# Patient Record
Sex: Male | Born: 1990 | Race: White | Hispanic: No | Marital: Single | Smoking: Never smoker
Health system: Southern US, Community
[De-identification: ages and names within clinical notes are randomized; demographics above are authoritative.]

## PROBLEM LIST (undated history)

## (undated) DIAGNOSIS — K2270 Barrett's esophagus without dysplasia: Secondary | ICD-10-CM

## (undated) DIAGNOSIS — K219 Gastro-esophageal reflux disease without esophagitis: Secondary | ICD-10-CM

## (undated) HISTORY — DX: Gastro-esophageal reflux disease without esophagitis: K21.9

## (undated) HISTORY — PX: UPPER GI ENDOSCOPY: SHX6162

## (undated) HISTORY — PX: WRIST SURGERY: SHX841

## (undated) HISTORY — DX: Barrett's esophagus without dysplasia: K22.70

## (undated) HISTORY — PX: FINGER FRACTURE SURGERY: SHX638

---
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
## 2010-07-05 DIAGNOSIS — K2270 Barrett's esophagus without dysplasia: Secondary | ICD-10-CM

## 2010-07-05 HISTORY — DX: Barrett's esophagus without dysplasia: K22.70

## 2010-11-20 NOTE — Op Note (Signed)
Reginald Mcdonald, Reginald Mcdonald                ACCOUNT NO.:  [id number]   MEDICAL RECORD NO.:  [id number]          PATIENT TYPE:  AMB   LOCATION:  DSC                          FACILITY:  [facility]   PHYSICIAN:  Katy Fitch. Sypher Jr., M.D.DATE OF BIRTH:  04-Jun-1991   DATE OF PROCEDURE:  04/03/2004  DATE OF DISCHARGE:                                 OPERATIVE REPORT   PREOPERATIVE DIAGNOSIS:  Painful mass, deep to thenar muscles adjacent to  scaphotrapeziotrapezoid joint, right wrist.   POSTOPERATIVE DIAGNOSIS:  Volar ganglion exiting from  scaphotrapeziotrapezoid articulation with subthenar muscle mass.   OPERATION:  Excision of capsular ganglion, right wrist, volar to  scaphotrapeziotrapezoid joint.   OPERATING SURGEON:  Katy Fitch. Sypher, M.D.   ASSISTANT:  Jonni Sanger, P.A.   ANESTHESIA:  General by LMA.   SUPERVISING ANESTHESIOLOGIST:  Guadalupe Maple, M.D.   INDICATIONS:  Reginald Mcdonald is a 20 year old, middle school student, and golf  athlete has had a painful mass develop at the base of his right thumb.  He  had pain while playing sports and while working out training.  Clinical  examination suggested a cystic mass deep to the thenar muscles.  This was  adjacent to the carpometacarpal joint and scaphotrapeziotrapezoid joint at  the base of his right thumb.   Due to a failure to respond to nonoperative measures, we recommended  exploration and excision.   He is brought to the operating room at this time.  Preoperatively, his  parents were advised that the pathophysiology of ganglions is poorly  understood.  They understand there is clear chance of recurrence or  reformation.   They understand that our goal is to remove the cyst, debride the joint  capsule, and to repair the capsule if possible.   After informed consent, he is brought to the operating room at this time.   PROCEDURE:  Reginald Mcdonald is brought to the operating room and placed in the  supine position upon the  operating table.  Following the induction of  general anesthesia by LMA, the right warm was prepped with Betadine soap and  solution and sterilely draped.  Following exsanguination of the limb with an  Esmarch bandage, an arterial tourniquet on the proximal brachium is inflated  to 220 mmHg.  The procedure commenced with a short oblique incision  paralleling the thenar creases.  Subcutaneous tissues were carefully divided  revealing a bulging abductor pollicis brevis muscle.   The interval between the opponens pollicis and abductor pollicis brevis  muscles was split bluntly, and immediately, a ganglion capsule the size of a  large marble was identified.   This was circumferentially dissected down to its capsular attachment.  This  appeared to be attached to the carpometacarpal joint of the thumb and had a  portion following proximally towards the flexor carpi radialis tendon sheath  radial aspect.  It was difficult to discern whether or not this was a  primary ganglion off the carpometacarpal joint of the thumb or had an  extension to the scaphotrapezial joint.   The capsule of the ganglion was entirely  excised, and the capsule of the  carpometacarpal joint as well as the scaphotrapeziotrapezoid joint was  debrided down to normal-appearing tissue.  The area of capsular opening was  then repaired with mattress suture of 4-0 Vicryl.   The wound was then repaired with simple suture of 4-0 Vicryl closing the  interval between the thenar muscles and repair of the skin with intradermal  3-0 Prolene.   A compressive dressing was applied with Xeroflo, sterile gauze, and an Ace  wrap.  There were no apparent complications.   Mr. Harpole was discharged to the care of his family with prescriptions for  Darvocet-N 100 1 p.o. q.4-6 h. p.r.n. pain.  He was also advised to use  Advil;  i.e., ibuprofen 400-600 mg p.o. q.6 h p.r.n. pain.       RVS/MEDQ  D:  04/03/2004  T:  04/03/2004  Job:   [id number]   cc:   Ernestina Penna, M.D.  [address] [place]  [place] [address]  Fax: [phone]

## 2010-12-21 ENCOUNTER — Encounter: Payer: Self-pay | Admitting: Gastroenterology

## 2011-01-26 ENCOUNTER — Encounter: Payer: Self-pay | Admitting: Gastroenterology

## 2011-01-26 ENCOUNTER — Ambulatory Visit (INDEPENDENT_AMBULATORY_CARE_PROVIDER_SITE_OTHER): Payer: BC Managed Care – PPO | Admitting: Gastroenterology

## 2011-01-26 VITALS — BP 138/72 | HR 64 | Ht 67.0 in | Wt 141.0 lb

## 2011-01-26 DIAGNOSIS — K219 Gastro-esophageal reflux disease without esophagitis: Secondary | ICD-10-CM

## 2011-01-26 MED ORDER — DEXLANSOPRAZOLE 60 MG PO CPDR: 60.0000 mg | DELAYED_RELEASE_CAPSULE | Freq: Every day | ORAL | Status: DC

## 2011-01-26 NOTE — Progress Notes (Signed)
History of Present Illness:  This is a extremely pleasant 20 year old Caucasian male referred through the courtesy of Dr. Rudi Heap for evaluation of years of acid reflux symptoms manifested by regurgitation, burning substernal chest pain, but no dysphagia. He has used over-the-counter" acid suppressor" without relief. He has not had previous endoscopy or barium studies. There is no history of alcohol, cigarette, or NSAID abuse, anorexia, weight loss, melena or hematochezia or any hepatobiliary complaints. 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 No history of Raynaud's phenomenon/ symptoms of collagen vascular disease.  I have reviewed this patient's present history, medical and surgical past history, allergies and medications.     ROS: The remainder of the 10 point ROS is negative     Physical Exam: General well developed well nourished patient in no acute distress, appearing his stated age Eyes PERRLA, no icterus, fundoscopic exam per opthamologist Skin no lesions noted Neck supple, no adenopathy, no thyroid enlargement, no tenderness Chest clear to percussion and auscultation Heart no significant murmurs, gallops or rubs noted Abdomen no hepatosplenomegaly masses or tenderness, BS normal.  Extremities no acute joint lesions, edema, phlebitis or evidence of cellulitis. Neurologic patient oriented x 3, cranial nerves intact, no focal neurologic deficits noted. Psychological mental status normal and normal affect.  Assessment and plan: Probable large hiatal hernia with chronic GERD and rule out Barrett's mucosa. Because of his young age and severe symptoms, he may be a candidate for early fundoplication referral. I placed him all Dexilant 60 mg a day, standard antireflux maneuvers, he saw our patient education video acid reflux in his management. Time of endoscopy we'll do biopsies for H. pylori infection.  Encounter Diagnosis  Name  Primary?  . Esophageal reflux Yes

## 2011-01-26 NOTE — Patient Instructions (Signed)
Your procedure has been scheduled for 02/03/2011, please follow the seperate instructions.  Take Dexilant 60mg  once a day samples given and an rx sent to your pharmacy.    Acid Reflux (GERD) Acid reflux is also called gastroesophageal reflux disease (GERD). Your stomach makes acid to help digest food. Acid reflux happens when acid from your stomach goes into the tube between your mouth and stomach (esophagus). Your stomach is protected from the acid, but this tube is not. When acid gets into the tube, it may cause a burning feeling in the chest (heartburn). Besides heartburn, other health problems can happen if the acid keeps going into the tube. Some causes of acid reflux include:  Being overweight.   Smoking.   Drinking alcohol.   Eating large meals.   Eating meals and then going to bed right away.   Eating certain foods.   Increased stomach acid production.  HOME CARE  Take all medicine as told by your doctor.   You may need to:   Lose weight.   Avoid alcohol.   Quit smoking.   Do not eat big meals. It is better to eat smaller meals throughout the day.   Do not eat a meal and then nap or go to bed.   Sleep with your head higher than your stomach.   Avoid foods that bother you.   You may need more tests, or you may need to see a special doctor.  GET HELP RIGHT AWAY IF:  You have chest pain that is different than before.   You have pain that goes to your arms, jaw, or between your shoulder blades.   You throw up (vomit) blood, dark brown liquid, or your throw up looks like coffee grounds.   You have trouble swallowing.   You have trouble breathing or cannot stop coughing.   You feel dizzy or pass out.   Your skin is cool, wet, and pale.   Your medicine is not helping.  MAKE SURE YOU:   Understand these instructions.   Will watch your condition.   Will get help right away if you are not doing well or get worse.  Document Released: 12/08/2007 Document  Re-Released: 09/15/2009 [facility] Patient Information 2011 [place], [place].

## 2011-01-27 ENCOUNTER — Other Ambulatory Visit: Payer: Self-pay | Admitting: *Deleted

## 2011-01-27 ENCOUNTER — Encounter: Payer: Self-pay | Admitting: Gastroenterology

## 2011-01-27 MED ORDER — ESOMEPRAZOLE MAGNESIUM 40 MG PO CPDR: 40.0000 mg | DELAYED_RELEASE_CAPSULE | Freq: Every day | ORAL | Status: DC

## 2011-01-27 NOTE — Telephone Encounter (Signed)
Pt aware of the change per insurance he must try Nexium or omeprazole. Nexium sent he will call back if it does not work well.

## 2011-01-28 ENCOUNTER — Other Ambulatory Visit: Payer: Self-pay | Admitting: *Deleted

## 2011-01-28 MED ORDER — OMEPRAZOLE 40 MG PO CPDR: 40.0000 mg | DELAYED_RELEASE_CAPSULE | Freq: Every day | ORAL | Status: DC

## 2011-01-28 NOTE — Telephone Encounter (Signed)
Pt requesting cheaper alt medication so I have sent in omeprazole

## 2011-02-03 ENCOUNTER — Ambulatory Visit (AMBULATORY_SURGERY_CENTER): Payer: BC Managed Care – PPO | Admitting: Gastroenterology

## 2011-02-03 ENCOUNTER — Other Ambulatory Visit: Payer: BC Managed Care – PPO | Admitting: Gastroenterology

## 2011-02-03 ENCOUNTER — Encounter: Payer: Self-pay | Admitting: Gastroenterology

## 2011-02-03 VITALS — BP 138/73 | HR 69 | Temp 98.5°F | Resp 18 | Ht 67.0 in | Wt 140.0 lb

## 2011-02-03 DIAGNOSIS — K219 Gastro-esophageal reflux disease without esophagitis: Secondary | ICD-10-CM

## 2011-02-03 DIAGNOSIS — K2270 Barrett's esophagus without dysplasia: Secondary | ICD-10-CM

## 2011-02-03 MED ORDER — SODIUM CHLORIDE 0.9 % IV SOLN: 500.0000 mL | INTRAVENOUS | Status: DC

## 2011-02-03 NOTE — Patient Instructions (Signed)
Follow discharge instructions.  Continue your medications.  Dr. Norval Gable office will call with follow up appointment.

## 2011-02-04 ENCOUNTER — Telehealth: Payer: Self-pay

## 2011-02-04 NOTE — Telephone Encounter (Signed)

## 2011-02-09 ENCOUNTER — Encounter: Payer: Self-pay | Admitting: Gastroenterology

## 2011-02-10 ENCOUNTER — Other Ambulatory Visit: Payer: BC Managed Care – PPO | Admitting: Gastroenterology

## 2011-03-10 ENCOUNTER — Encounter: Payer: Self-pay | Admitting: *Deleted

## 2011-03-16 ENCOUNTER — Encounter: Payer: Self-pay | Admitting: Gastroenterology

## 2011-03-16 ENCOUNTER — Ambulatory Visit (INDEPENDENT_AMBULATORY_CARE_PROVIDER_SITE_OTHER): Payer: BC Managed Care – PPO | Admitting: Gastroenterology

## 2011-03-16 VITALS — BP 132/74 | HR 64 | Ht 67.0 in | Wt 139.0 lb

## 2011-03-16 DIAGNOSIS — L659 Nonscarring hair loss, unspecified: Secondary | ICD-10-CM | POA: Insufficient documentation

## 2011-03-16 DIAGNOSIS — K219 Gastro-esophageal reflux disease without esophagitis: Secondary | ICD-10-CM | POA: Insufficient documentation

## 2011-03-16 DIAGNOSIS — K2270 Barrett's esophagus without dysplasia: Secondary | ICD-10-CM | POA: Insufficient documentation

## 2011-03-16 MED ORDER — FINASTERIDE 1 MG PO TABS: 1.0000 mg | ORAL_TABLET | Freq: Every day | ORAL | Status: DC

## 2011-03-16 NOTE — Progress Notes (Signed)
History of Present Illness: This is a 20 year old Caucasian male with chronic GERD, currently doing well on omeprazole 40 mg a day. 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    Current Medications, Allergies, Past Medical History, Past Surgical History, Family History and Social History were reviewed in [employer] record.   Assessment and plan: Acid reflux reviewed with patient. I have her to continue regular PPI therapy with office followups yearly and endoscopy every 3 years for dysplasia screening. We have given him a prescription for Propecia as requested. We discussed Barrett's mucosa at length, and I think he understands its etiology and pathology. We will see him yearly or when necessary basis. Printed information concerning Barrett's mucosa supplied to the patient. No diagnosis found.

## 2011-03-16 NOTE — Patient Instructions (Signed)
Your prescription(s) have been sent to you pharmacy.   Barrett's Esophagus The esophagus is the muscular tube that carries food and saliva from the mouth to the stomach. Barrett's esophagus involves changes in the esophagus. Some of its lining is replaced by a type of tissue similar to that found in the intestine. This process is called intestinal metaplasia. While Barrett's esophagus may cause no symptoms itself, a small number of people with this condition develop a relatively rare but often deadly type of cancer of the esophagus. It is called esophageal adenocarcinoma. Barrett's esophagus is associated with the common condition called GERD (gastroesophageal reflux disease). HOW THE ESOPHAGUS WORKS The esophagus carries food, liquids, and saliva from the mouth to the stomach. The stomach acts as a container to start digestion and pump food and liquids into the intestines in a controlled process. Food can then be properly digested over time. Nutrients can be taken in (absorbed) by the intestines. The esophagus moves food to the stomach by coordinated contractions of its muscular lining. This process is automatic. People are usually not aware of it. Many people have felt their esophagus when they:  Swallow something too large.   Try to eat too quickly.   Drink very hot or cold liquids.  They then feel the movement of the food or drink down the esophagus into the stomach. This may be an uncomfortable feeling. DIGESTIVE TRACT The muscular layers of the esophagus are normally pinched together at both the upper and lower ends by muscles. These muscles are called sphincters. When a person swallows, the sphincters relax automatically. This allows food or drink to pass from the mouth, into the stomach. The muscles then close rapidly. This prevents the swallowed food or drink from leaking out of the stomach, back into the esophagus or into the mouth. These muscles make it possible to swallow while lying down  or even upside-down. When people belch to release swallowed air or gas from carbonated beverages, the sphincters relax. Then small amounts of food or drink may come back up, briefly. This condition is called reflux. The esophagus quickly squeezes the material back into the stomach. This is considered normal. These functions of the esophagus are an important part of everyday life. However, people who must have their esophagus removed, for example because of cancer, can live a relatively healthy life without it. GERD (GASTROESOPHAGEAL REFLUX DISEASE) Having some stomach contents (liquids or gas) sometimes reflux (come back up from the stomach into the esophagus) is considered normal. When it happens often, and causes other symptoms, it is considered a medical problem or disease. However, it is not necessarily a serious one or one that requires seeing a caregiver. The stomach produces acid and enzymes to digest food. When this mixture refluxes into the esophagus more often than normal or for a longer period of time than normal, it may produce symptoms. These symptoms are often called acid reflux. They are often described by people as heartburn, indigestion, or "gas". The symptoms often consist of a burning sensation below and behind the lower part of the breastbone or sternum. Almost everyone has experienced these symptoms at least once. This is typically a result of overeating. Other things that provoke GERD symptoms include:  Being overweight.   Eating certain types of foods.   Being pregnant.  In most people, GERD symptoms may last only a short time and require no treatment at all.  More continual symptoms are often quickly relieved by over-the-counter acid-reducing agents, such as antacids.  Other drugs used to relieve GERD symptoms are antisecretory drugs, such as histamine2 (H2) blockers or proton pump inhibitors. People who have symptoms often should talk with their caregiver. Other diseases can  have similar symptoms. Prescription medicines, together with other actions, might be needed to reduce reflux. GERD that is untreated over a long period can lead to problems. An example is an ulcer in the esophagus, that could cause bleeding. Another common problem is scar tissue that blocks the movement of swallowed food and drink through the esophagus. This condition is called stricture. Esophageal reflux may also cause certain less common symptoms. These include hoarseness or chronic cough. It sometimes provokes conditions such as asthma. While most patients find that lifestyle changes and acid-blocking drugs relieve their symptoms, caregivers sometimes advise surgery. Overall, GERD is one of the most common medical conditions. About 20 percent of the population can be affected over a lifetime. GERD AND BARRETT'S ESOPHAGUS The exact causes of Barrett's esophagus are not known. It is thought to be caused in part by the same factors that cause GERD. People who do not have heartburn can have Barrett's esophagus. However, it is found about 3 to 5 times more often in people with this condition. Barrett's esophagus is uncommon in children. The average age at diagnosis is 41. But it is usually difficult to know when the problem started. It is about twice as common in men as in women. It is much more common in white men than in men of other races. BARRETT'S ESOPHAGUS AND CANCER OF THE ESOPHAGUS Barrett's esophagus does not cause symptoms itself. However, it seems to precede the development of a particular kind of cancer. This cancer is esophageal adenocarcinoma. The risk of developing this cancer is 30 to 125 times higher in people who have Barrett's esophagus than in people who do not. This type of cancer is increasing quickly in white men. The increase is possibly related to the rise in obesity and GERD. For people who have Barrett's esophagus, the risk of getting cancer of the esophagus is small. It is less than  1 percent (0.4 percent to 0.5 percent) per year. Esophageal adenocarcinoma is often not curable. This is partly because the disease is often discovered at a late stage and treatments are not effective. DIAGNOSIS (HOW TO TELL WHAT IS WRONG) AND SCREENING Diagnosing Barrett's esophagus is not easy. At the present time it cannot be diagnosed based on symptoms, physical exam, or blood tests. The only useful test is upper gastrointestinal endoscopy and biopsy. In this procedure, a flexible tube called an endoscope is used. This tool is like a pencil sized flexible telescope. It has a light and tiny camera. It is passed into the esophagus. If the tissue appears suspicious to your caregiver, biopsies must be done. A biopsy is the removal of a small piece of tissue. This is done using a pincher-like device passed through the endoscope. A pathologist is a specialist who examines body tissue samples. He or she examines the tissue under a microscope to confirm the diagnosis. Looking for a medical problem in people who do not know whether they have one is called screening. Currently, there are no commonly accepted guidelines on who should have endoscopy, to check for Barrett's esophagus. There are many reasons for the lack of firm recommendations about screening. Among them are the great cost and occasional risk of side effects of the test. Also, the rate of finding Barrett's esophagus is low. Finding the problem early has not been  proven to prevent deaths from cancer. Many caregivers advise that adult patients who are over the age of 39 and have had GERD symptoms for a number of years have endoscopy, to see whether they have Barrett's esophagus. Screening for this condition in people who have no symptoms is not advised. TREATMENT Barrett's esophagus has no cure, other than surgical removal of the esophagus. This is a serious operation. Surgery is advised only for people who have a high risk of developing cancer or who  already have it. Most caregivers recommend treating GERD with acid-blocking drugs. This is sometimes linked to improvement in the extent of the Barrett's tissue. But this approach has not been proven to reduce the risk of cancer. Treating reflux with surgery for GERD also does not seem to cure Barrett's esophagus. Several experimental approaches are under study. One attempts to see whether destroying the Barrett's tissue by heat or other means, through an endoscope, can get rid of the condition. But this approach has potential risks and unknown effectiveness. LOOKING FOR DYSPLASIA AND CANCER Occasional endoscopic examinations to look for early warning signs of cancer are generally advised for people who have Barrett's esophagus. This approach is called surveillance. When people who have Barrett's esophagus develop cancer, the process seems to go through an intermediate stage. In this stage cancer cells appear in the Barrett's tissue. This condition is called dysplasia. It can be seen only in biopsies with a microscope. The process is patchy and cannot be seen directly through the endoscope. So, multiple biopsies must be taken. Even then, it can be missed. The process of change from Barrett's to cancer seems to happen only in a few patients. It is less than 1 percent per year. And it happens over a relatively long period of time. Most caregivers advise that patients with Barrett's esophagus undergo occasional endoscopy to have biopsies. The recommended time between endoscopies varies depending on circumstances. The best time interval has not been decided. TREATMENT FOR DYSPLASIA OR ESOPHAGEAL ADENOCARCINOMA If a person with Barrett's esophagus is found to have dysplasia or cancer, the caregiver will usually recommend surgery. This is if the person is strong enough and has a good chance of being cured. The type of surgery may vary. But it usually involves removing most of the esophagus and pulling the stomach up  into the chest to attach it to what remains of the esophagus. Many patients with Barrett's esophagus are elderly. They may have many other medical problems that make surgery unwise. In these patients, other methods to treat dysplasia are being studied. HOPE THROUGH RESEARCH Many important questions about Barrett's esophagus need further research to:  Find better ways to identify people who have the problem.   Find out what causes it.   Test treatments that may prevent or get rid of it.   Find better treatments for people who have Barrett's esophagus with cancer.  The [employer] and [employer]est[employer] Kid[employer] Diseases, and the [employer], sponsor research programs to study Barrett's esophagus. IMPORTANT POINTS TO REMEMBER  In Barrett's esophagus, the cells lining the esophagus change. They become similar to the cells lining the intestine.   Barrett's esophagus is connected with gastroesophageal reflux disease or GERD.   A small number of people with Barrett's esophagus may develop esophageal cancer.   Barrett's esophagus is diagnosed by upper gastrointestinal endoscopy and biopsy.   People who have Barrett's esophagus should have periodic esophageal exams.   Taking acid-blocking drugs for GERD may help  improve Barrett's esophagus.   Removal of the esophagus is recommended only for people who have a high risk of developing cancer or who already have it.  ADDITIONAL RESOURCES For more information about GERD or Barrett's esophagus, contact: [employer] ([employer] [address] [place], [place] [address] Phone: [phone] or [phone] Fax: [phone] Email: iffgd@iffgd .org Internet: www.iffgd.[facility] [employer] Information [employer] [address] [place], [place] [address] Email: nddic@info .[email or web address] Document Released: 09/11/2003 Document Re-Released:  12/09/2009 [facility] Patient Information 2011 [place], [place].

## 2011-04-14 LAB — URINALYSIS, ROUTINE W REFLEX MICROSCOPIC
Glucose, UA: NEGATIVE
Hgb urine dipstick: NEGATIVE
Leukocytes, UA: NEGATIVE
Protein, ur: 100 — AB
pH: 6

## 2011-04-14 LAB — URINE MICROSCOPIC-ADD ON

## 2011-07-15 ENCOUNTER — Other Ambulatory Visit: Payer: Self-pay | Admitting: Gastroenterology

## 2011-07-20 ENCOUNTER — Other Ambulatory Visit: Payer: Self-pay | Admitting: Gastroenterology

## 2011-07-20 MED ORDER — FINASTERIDE 1 MG PO TABS: 1.0000 mg | ORAL_TABLET | Freq: Every day | ORAL | Status: DC

## 2011-07-20 NOTE — Telephone Encounter (Signed)
Left message rx now sent

## 2011-09-01 ENCOUNTER — Other Ambulatory Visit: Payer: Self-pay | Admitting: Gastroenterology

## 2011-11-15 ENCOUNTER — Other Ambulatory Visit: Payer: Self-pay | Admitting: Gastroenterology

## 2012-03-16 ENCOUNTER — Other Ambulatory Visit: Payer: Self-pay | Admitting: Gastroenterology

## 2012-04-01 ENCOUNTER — Other Ambulatory Visit: Payer: Self-pay | Admitting: Gastroenterology

## 2012-04-04 ENCOUNTER — Other Ambulatory Visit: Payer: Self-pay | Admitting: Gastroenterology

## 2012-06-16 ENCOUNTER — Other Ambulatory Visit: Payer: Self-pay | Admitting: Gastroenterology

## 2012-06-16 NOTE — Telephone Encounter (Signed)
PATIENT NEEDS OFFICE VISIT FOR FURTHER REFILLS  

## 2012-08-11 ENCOUNTER — Other Ambulatory Visit: Payer: Self-pay | Admitting: Gastroenterology

## 2012-08-14 NOTE — Telephone Encounter (Signed)
PATIENT NEEDS OFFICE VISIT FOR FURTHER REFILLS  

## 2012-09-12 ENCOUNTER — Other Ambulatory Visit: Payer: Self-pay | Admitting: Gastroenterology

## 2012-10-09 ENCOUNTER — Telehealth: Payer: Self-pay | Admitting: Gastroenterology

## 2012-10-09 MED ORDER — FINASTERIDE 1 MG PO TABS: ORAL_TABLET | ORAL | Status: DC

## 2012-10-09 NOTE — Telephone Encounter (Signed)
Pt requested a refill on finasteride. Explained I will refill for 1 month, but he needs an appt. Pt will come in on 10/19/12.

## 2012-10-19 ENCOUNTER — Encounter: Payer: Self-pay | Admitting: Gastroenterology

## 2012-10-19 ENCOUNTER — Other Ambulatory Visit (INDEPENDENT_AMBULATORY_CARE_PROVIDER_SITE_OTHER): Payer: BC Managed Care – PPO

## 2012-10-19 ENCOUNTER — Ambulatory Visit (INDEPENDENT_AMBULATORY_CARE_PROVIDER_SITE_OTHER): Payer: BC Managed Care – PPO | Admitting: Gastroenterology

## 2012-10-19 ENCOUNTER — Telehealth: Payer: Self-pay | Admitting: Gastroenterology

## 2012-10-19 VITALS — BP 104/62 | HR 75 | Ht 67.5 in | Wt 144.0 lb

## 2012-10-19 DIAGNOSIS — K6389 Other specified diseases of intestine: Secondary | ICD-10-CM

## 2012-10-19 DIAGNOSIS — R1310 Dysphagia, unspecified: Secondary | ICD-10-CM

## 2012-10-19 DIAGNOSIS — R198 Other specified symptoms and signs involving the digestive system and abdomen: Secondary | ICD-10-CM

## 2012-10-19 DIAGNOSIS — K2270 Barrett's esophagus without dysplasia: Secondary | ICD-10-CM

## 2012-10-19 DIAGNOSIS — K219 Gastro-esophageal reflux disease without esophagitis: Secondary | ICD-10-CM

## 2012-10-19 LAB — HEPATIC FUNCTION PANEL
ALT: 21 U/L (ref 0–53)
AST: 15 U/L (ref 0–37)
Alkaline Phosphatase: 65 U/L (ref 39–117)
Bilirubin, Direct: 0.1 mg/dL (ref 0.0–0.3)
Total Bilirubin: 0.8 mg/dL (ref 0.3–1.2)

## 2012-10-19 LAB — BASIC METABOLIC PANEL
Chloride: 105 mEq/L (ref 96–112)
GFR: 146.04 mL/min (ref >60.00)
Potassium: 3.9 mEq/L (ref 3.5–5.1)
Sodium: 139 mEq/L (ref 135–145)

## 2012-10-19 LAB — TSH: TSH: 0.86 u[IU]/mL (ref 0.35–5.50)

## 2012-10-19 LAB — CBC WITH DIFFERENTIAL/PLATELET
Eosinophils Absolute: 0.1 10*3/uL (ref 0.0–0.7)
Eosinophils Relative: 2.6 % (ref 0.0–5.0)
HCT: 43.6 % (ref 39.0–52.0)
Lymphs Abs: 2 10*3/uL (ref 0.7–4.0)
MCHC: 34.6 g/dL (ref 30.0–36.0)
MCV: 86.4 fl (ref 78.0–100.0)
Monocytes Absolute: 0.5 10*3/uL (ref 0.1–1.0)
Platelets: 199 10*3/uL (ref 150.0–400.0)
RDW: 12.9 % (ref 11.5–14.6)
WBC: 5.4 10*3/uL (ref 4.5–10.5)

## 2012-10-19 LAB — FERRITIN: Ferritin: 131.1 ng/mL (ref 22.0–322.0)

## 2012-10-19 LAB — SEDIMENTATION RATE: Sed Rate: 4 mm/hr (ref 0–22)

## 2012-10-19 MED ORDER — PEG-KCL-NACL-NASULF-NA ASC-C 100 G PO SOLR: 1.0000 | Freq: Once | ORAL | Status: DC

## 2012-10-19 NOTE — Telephone Encounter (Signed)
Pt reports he can't afford to have both procedures done now, so he would like to R/S and do the EGD only; he will have EGD on 10/25/12.

## 2012-10-19 NOTE — Progress Notes (Signed)
This is a 22 year old Caucasian male with chronic GERD and endoscopy documented Barrett's mucosa.  He is been on Prilosec 40 mg a day but continues to complain of postprandial fullness in the subxiphoid area with what sounds like possible dysphagia for solid foods.  He also has crampy lower abdominal pain with alternating diarrhea and constipation, but denies rectal bleeding.  He is a father that has Crohn's disease.  He denies fever, chills, or systemic complaints.  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  Specifically denies rectal bleeding, hematemesis, anorexia weight loss, or other symptoms of malabsorption.  Current Medications, Allergies, Past Medical History, Past Surgical History, Family History and Social History were reviewed in [employer] record.  ROS: All systems were reviewed and are negative unless otherwise stated in the HPI.          Physical Exam: Healthy-appearing patient in no distress.  Blood pressure 104 was 62, pulse 75 and regular, and weight 144 with a BMI of 22.21.  Cannot appreciate stigmata of chronic liver disease.  Chest is clear he is in a regular rhythm without murmurs gallops or rubs.  There is no organomegaly, abdominal masses or tenderness.  Bowel sounds are normal.  Inspection of rectum is unremarkable as is rectal exam.  Stool is present and is guaiac negative.  Mental status is normal.    Assessment and Plan: I'm concerned that this patient may have an esophageal stricture associated with his chronic reflux and Barrett's mucosa, and he needs followup endoscopy and possible dilatation.  I have also scheduled him for colonoscopy per his bowel difficulties and his family history of inflammatory bowel disease.  He also may need high-resolution esophageal manometry.  Have urged him to continue his daily PPI therapy, and to avoid me sincerely been able to repeat his endoscopic exam and possible dilatation.   He also needs repeat esophageal biopsies for dysplasia screening Encounter Diagnoses  Name Primary?  Marland Kitchen Dysphagia, unspecified Yes  . Irregular bowel habits

## 2012-10-19 NOTE — Patient Instructions (Addendum)
Your physician has requested that you go to the basement for lab work before leaving today.  You have been scheduled for an endoscopy and colonoscopy with propofol. Please follow the written instructions given to you at your visit today. Please pick up your prep at the pharmacy within the next 1-3 days. If you use inhalers (even only as needed), please bring them with you on the day of your procedure.  Start Metamucil over the counter fiber supplement daily.  cc: Rudi Heap, MD  High-Fiber Diet Fiber is found in fruits, vegetables, and grains. A high-fiber diet encourages the addition of more whole grains, legumes, fruits, and vegetables in your diet. The recommended amount of fiber for adult males is 38 g per day. For adult females, it is 25 g per day. Pregnant and lactating women should get 28 g of fiber per day. If you have a digestive or bowel problem, ask your caregiver for advice before adding high-fiber foods to your diet. Eat a variety of high-fiber foods instead of only a select few type of foods.  PURPOSE  To increase stool bulk.  To make bowel movements more regular to prevent constipation.  To lower cholesterol.  To prevent overeating. WHEN IS THIS DIET USED?  It may be used if you have constipation and hemorrhoids.  It may be used if you have uncomplicated diverticulosis (intestine condition) and irritable bowel syndrome.  It may be used if you need help with weight management.  It may be used if you want to add it to your diet as a protective measure against atherosclerosis, diabetes, and cancer. SOURCES OF FIBER  Whole-grain breads and cereals.  Fruits, such as apples, oranges, bananas, berries, prunes, and pears.  Vegetables, such as green peas, carrots, sweet potatoes, beets, broccoli, cabbage, spinach, and artichokes.  Legumes, such split peas, soy, lentils.  Almonds. FIBER CONTENT IN FOODS Starches and Grains / Dietary Fiber (g)  Cheerios, 1 cup / 3  g  Corn Flakes cereal, 1 cup / 0.7 g  Rice crispy treat cereal, 1 cup / 0.3 g  Instant oatmeal (cooked),  cup / 2 g  Frosted wheat cereal, 1 cup / 5.1 g  Brown, long-grain rice (cooked), 1 cup / 3.5 g  White, long-grain rice (cooked), 1 cup / 0.6 g  Enriched macaroni (cooked), 1 cup / 2.5 g Legumes / Dietary Fiber (g)  Baked beans (canned, plain, or vegetarian),  cup / 5.2 g  Kidney beans (canned),  cup / 6.8 g  Pinto beans (cooked),  cup / 5.5 g Breads and Crackers / Dietary Fiber (g)  Plain or honey graham crackers, 2 squares / 0.7 g  Saltine crackers, 3 squares / 0.3 g  Plain, salted pretzels, 10 pieces / 1.8 g  Whole-wheat bread, 1 slice / 1.9 g  White bread, 1 slice / 0.7 g  Raisin bread, 1 slice / 1.2 g  Plain bagel, 3 oz / 2 g  Flour tortilla, 1 oz / 0.9 g  Corn tortilla, 1 small / 1.5 g  Hamburger or hotdog bun, 1 small / 0.9 g Fruits / Dietary Fiber (g)  Apple with skin, 1 medium / 4.4 g  Sweetened applesauce,  cup / 1.5 g  Banana,  medium / 1.5 g  Grapes, 10 grapes / 0.4 g  Orange, 1 small / 2.3 g  Raisin, 1.5 oz / 1.6 g  Melon, 1 cup / 1.4 g Vegetables / Dietary Fiber (g)  Green beans (canned),  cup /  1.3 g  Carrots (cooked),  cup / 2.3 g  Broccoli (cooked),  cup / 2.8 g  Peas (cooked),  cup / 4.4 g  Mashed potatoes,  cup / 1.6 g  Lettuce, 1 cup / 0.5 g  Corn (canned),  cup / 1.6 g  Tomato,  cup / 1.1 g Document Released: 06/21/2005 Document Revised: 12/21/2011 Document Reviewed: 09/23/2011 [facility] Patient Information 2013 [place], [place].

## 2012-10-25 ENCOUNTER — Telehealth: Payer: Self-pay | Admitting: *Deleted

## 2012-10-25 ENCOUNTER — Encounter: Payer: Self-pay | Admitting: Gastroenterology

## 2012-10-25 ENCOUNTER — Ambulatory Visit (AMBULATORY_SURGERY_CENTER): Payer: BC Managed Care – PPO | Admitting: Gastroenterology

## 2012-10-25 VITALS — BP 122/63 | HR 68 | Temp 96.5°F | Resp 20 | Ht 67.5 in | Wt 144.0 lb

## 2012-10-25 DIAGNOSIS — R1310 Dysphagia, unspecified: Secondary | ICD-10-CM

## 2012-10-25 DIAGNOSIS — K2270 Barrett's esophagus without dysplasia: Secondary | ICD-10-CM

## 2012-10-25 DIAGNOSIS — K6389 Other specified diseases of intestine: Secondary | ICD-10-CM

## 2012-10-25 DIAGNOSIS — K219 Gastro-esophageal reflux disease without esophagitis: Secondary | ICD-10-CM

## 2012-10-25 DIAGNOSIS — E538 Deficiency of other specified B group vitamins: Secondary | ICD-10-CM

## 2012-10-25 MED ORDER — SODIUM CHLORIDE 0.9 % IV SOLN: 500.0000 mL | INTRAVENOUS | Status: DC

## 2012-10-25 MED ORDER — CYANOCOBALAMIN 1000 MCG/ML IJ SOLN: INTRAMUSCULAR | Status: DC

## 2012-10-25 NOTE — Patient Instructions (Signed)
Handouts were given to your care partner on barrett's esophagus and a dilatation diet and GERD.  Continue your current medications today.  #rd floor nurse will call about B12 rx.  Please call if any questions or concerns.     YOU HAD AN ENDOSCOPIC PROCEDURE TODAY AT THE [facility]: Refer to the procedure report that was given to you for any specific questions about what was found during the examination.  If the procedure report does not answer your questions, please call your gastroenterologist to clarify.  If you requested that your care partner not be given the details of your procedure findings, then the procedure report has been included in a sealed envelope for you to review at your convenience later.  YOU SHOULD EXPECT: Some feelings of bloating in the abdomen. Passage of more gas than usual.  Walking can help get rid of the air that was put into your GI tract during the procedure and reduce the bloating. If you had a lower endoscopy (such as a colonoscopy or flexible sigmoidoscopy) you may notice spotting of blood in your stool or on the toilet paper. If you underwent a bowel prep for your procedure, then you may not have a normal bowel movement for a few days.  DIET:    Drink plenty of fluids but you should avoid alcoholic beverages for 24 hours.  Please follow the dilatation diet the rest of the day.  ACTIVITY: Your care partner should take you home directly after the procedure.  You should plan to take it easy, moving slowly for the rest of the day.  You can resume normal activity the day after the procedure however you should NOT DRIVE or use heavy machinery for 24 hours (because of the sedation medicines used during the test).    SYMPTOMS TO REPORT IMMEDIATELY: A gastroenterologist can be reached at any hour.  During normal business hours, 8:30 AM to 5:00 PM Monday through Friday, call [phone].  After hours and on weekends, please call the GI answering service at [phone] who will take a message and have the physician on call contact you.     Following upper endoscopy (EGD)  Vomiting of blood or coffee ground material  New chest pain or pain under the shoulder blades  Painful or persistently difficult swallowing  New shortness of breath  Fever of 100F or higher  Black, tarry-looking stools  FOLLOW UP: If any biopsies were taken you will be contacted by phone or by letter within the next 1-3 weeks.  Call your gastroenterologist if you have not heard about the biopsies in 3 weeks.  Our staff will call the home number listed on your records the next business day following your procedure to check on you and address any questions or concerns that you may have at that time regarding the information given to you following your procedure. This is a courtesy call and so if there is no answer at the home number and we have not heard from you through the emergency physician on call, we will assume that you have returned to your regular daily activities without incident.  SIGNATURES/CONFIDENTIALITY: You and/or your care partner have signed paperwork which will be entered into your electronic medical record.  These signatures attest to the fact that that the information above on your After Visit Summary has been reviewed and is understood.  Full responsibility of the confidentiality of this discharge information lies with you and/or your care-partner.

## 2012-10-25 NOTE — Telephone Encounter (Signed)
Notes Recorded by Linna Hoff, RN on 10/24/2012 at 4:42 PM Will speak to pt tomorrow in [facility] prior to his EGD. Called [facility] in [place] for B12 and if pt chooses, he may come there to get them. Notes Recorded by Mardella Layman, MD on 10/23/2012 at 8:18 AM Needs b12 shots      Dr Jarold Motto orders B12 injections once weekly for 3 weeks, then monthly for 1 year with a lab recheck then. Dr [facility] office in [place] will do the injections for him; I will send the orders to Dr Christell Constant is he wishes.

## 2012-10-25 NOTE — Telephone Encounter (Signed)
Spoke with pt and his mom about the need for B12 Injections and he will go to Dr Kath[facility] for the injections. Will fax the order to [phone].

## 2012-10-25 NOTE — Op Note (Signed)
[facility] [address] [place], [address]   ENDOSCOPY PROCEDURE REPORT  PATIENT: Reginald Mcdonald, Reginald Mcdonald  MR#: [id number] BIRTHDATE: [birth year] , 22  yrs. old GENDER: Male ENDOSCOPIST:Loneta Tamplin Hale Bogus, MD, [facility] Graham REFERRED BY: Rudi Heap, M.D. PROCEDURE DATE:  10/25/2012 PROCEDURE:   EGD w/ biopsy and Maloney dilation of esophagus ASA CLASS:    Class I INDICATIONS: Barrett's screening and Dysphagia. MEDICATION: propofol (Diprivan) 200mg  IV TOPICAL ANESTHETIC:   Cetacaine Spray  DESCRIPTION OF PROCEDURE:   After the risks and benefits of the procedure were explained, informed consent was obtained.  The LB [id number]  endoscope was introduced through the mouth  and advanced to the second portion of the duodenum .  The instrument was slowly withdrawn as the mucosa was fully examined.      DUODENUM: The duodenal mucosa showed no abnormalities.  STOMACH: The mucosa of the stomach appeared normal.  ESOPHAGUS: There was evidence of suspected Barrett's esophagus in the lower third of the esophagus.  Multiple biopsies were performed using cold forceps. NBI imaging done...see pictures.Dilated #38F Maloney dilator,   Retroflexed views revealed a small 2 cm. hiatal hernia.    The scope was then withdrawn from the patient and the procedure completed.  COMPLICATIONS: There were no complications.   ENDOSCOPIC IMPRESSION: 1.   The duodenal mucosa showed no abnormalities 2.   The mucosa of the stomach appeared normal 3.   There was evidence of suspected Barrett's esophagus; multiple biopsies .Marland Kitchen?? stricture,dilation done,slight heme and no pain.This patient has treated chronic GERD.  RECOMMENDATIONS: 1.  Await pathology results 2.  Continue current medications 3. Needs B12 RX.Marland KitchenMarland Kitchen?? underlying IBD. 3.  Dilatations PRN    _______________________________ eSignedMardella Layman, MD, [facility] 10/25/2012 11:25 AM   standard discharge   PATIENT NAME:  Reginald Mcdonald, Reginald Mcdonald MR#: [id number]

## 2012-10-25 NOTE — Progress Notes (Signed)
No complaints noted in the recovery room. Maw  Patient did not experience any of the following events: a burn prior to discharge; a fall within the facility; wrong site/side/patient/procedure/implant event; or a hospital transfer or hospital admission upon discharge from the facility. (G8907) Patient did not have preoperative order for IV antibiotic SSI prophylaxis. (G8918)  

## 2012-10-26 ENCOUNTER — Telehealth: Payer: Self-pay | Admitting: *Deleted

## 2012-10-26 NOTE — Telephone Encounter (Signed)
  Follow up Call-  Call back number 10/25/2012 02/03/2011  Post procedure Call Back phone  # [phone]  Permission to leave phone message Yes -     Patient questions:  Do you have a fever, pain , or abdominal swelling? no Pain Score  0 *  Have you tolerated food without any problems? yes  Have you been able to return to your normal activities? yes  Do you have any questions about your discharge instructions: Diet   no Medications  no Follow up visit  no  Do you have questions or concerns about your Care? no  Actions: * If pain score is 4 or above: No action needed, pain <4.

## 2012-10-30 ENCOUNTER — Encounter: Payer: Self-pay | Admitting: Gastroenterology

## 2012-11-05 ENCOUNTER — Other Ambulatory Visit: Payer: Self-pay | Admitting: Gastroenterology

## 2012-11-22 ENCOUNTER — Encounter: Payer: BC Managed Care – PPO | Admitting: Gastroenterology

## 2012-12-04 ENCOUNTER — Other Ambulatory Visit: Payer: Self-pay | Admitting: Gastroenterology

## 2012-12-04 NOTE — Telephone Encounter (Signed)
PATIENT WILL NEED AN OFFICE VISIT FOR FURTHER REFILLS  

## 2013-01-01 ENCOUNTER — Other Ambulatory Visit: Payer: Self-pay | Admitting: Gastroenterology

## 2013-01-01 NOTE — Telephone Encounter (Signed)
Do you still want to refill this patients  Propecia?  RX started in 2012, patient last office visit 10-2012

## 2013-01-31 ENCOUNTER — Ambulatory Visit: Payer: BC Managed Care – PPO | Admitting: Gastroenterology

## 2013-01-31 ENCOUNTER — Encounter: Payer: Self-pay | Admitting: *Deleted

## 2013-01-31 ENCOUNTER — Other Ambulatory Visit: Payer: Self-pay | Admitting: Gastroenterology

## 2013-01-31 NOTE — Telephone Encounter (Signed)
NEEDS OFFICE VISIT FOR FURTHER REFILLS  

## 2013-02-28 ENCOUNTER — Other Ambulatory Visit: Payer: Self-pay | Admitting: *Deleted

## 2013-02-28 ENCOUNTER — Other Ambulatory Visit: Payer: Self-pay | Admitting: Gastroenterology

## 2013-02-28 MED ORDER — FINASTERIDE 1 MG PO TABS: 1.0000 mg | ORAL_TABLET | Freq: Every day | ORAL | Status: DC

## 2013-02-28 NOTE — Telephone Encounter (Signed)
Error in not sending medication Medication resent

## 2013-04-04 ENCOUNTER — Other Ambulatory Visit: Payer: Self-pay | Admitting: Gastroenterology

## 2013-05-04 ENCOUNTER — Other Ambulatory Visit: Payer: Self-pay | Admitting: Gastroenterology

## 2013-10-29 ENCOUNTER — Telehealth: Payer: Self-pay | Admitting: Internal Medicine

## 2013-10-29 NOTE — Telephone Encounter (Signed)
Left message for patient to call back  

## 2013-10-30 NOTE — Telephone Encounter (Signed)
Patient will come in and see Dr. Russella DarStark on 11/12/13 for GERD and abdominal pain

## 2013-11-12 ENCOUNTER — Encounter: Payer: Self-pay | Admitting: Gastroenterology

## 2013-11-12 ENCOUNTER — Other Ambulatory Visit (INDEPENDENT_AMBULATORY_CARE_PROVIDER_SITE_OTHER): Payer: BC Managed Care – PPO

## 2013-11-12 ENCOUNTER — Ambulatory Visit (INDEPENDENT_AMBULATORY_CARE_PROVIDER_SITE_OTHER): Payer: BC Managed Care – PPO | Admitting: Gastroenterology

## 2013-11-12 VITALS — BP 110/70 | HR 80 | Ht 67.5 in | Wt 147.6 lb

## 2013-11-12 DIAGNOSIS — K5900 Constipation, unspecified: Secondary | ICD-10-CM

## 2013-11-12 DIAGNOSIS — R109 Unspecified abdominal pain: Secondary | ICD-10-CM

## 2013-11-12 LAB — CBC WITH DIFFERENTIAL/PLATELET
BASOS PCT: 0.9 % (ref 0.0–3.0)
Basophils Absolute: 0.1 10*3/uL (ref 0.0–0.1)
EOS ABS: 0.2 10*3/uL (ref 0.0–0.7)
Eosinophils Relative: 2.9 % (ref 0.0–5.0)
HEMATOCRIT: 43.9 % (ref 39.0–52.0)
HEMOGLOBIN: 15.3 g/dL (ref 13.0–17.0)
LYMPHS ABS: 3.1 10*3/uL (ref 0.7–4.0)
LYMPHS PCT: 45.5 % (ref 12.0–46.0)
MCHC: 34.8 g/dL (ref 30.0–36.0)
MCV: 85.7 fl (ref 78.0–100.0)
MONO ABS: 0.5 10*3/uL (ref 0.1–1.0)
Monocytes Relative: 6.8 % (ref 3.0–12.0)
NEUTROS ABS: 3 10*3/uL (ref 1.4–7.7)
Neutrophils Relative %: 43.9 % (ref 43.0–77.0)
Platelets: 209 10*3/uL (ref 150.0–400.0)
RBC: 5.12 Mil/uL (ref 4.22–5.81)
RDW: 12.8 % (ref 11.5–15.5)
WBC: 6.8 10*3/uL (ref 4.0–10.5)

## 2013-11-12 LAB — VITAMIN B12: Vitamin B-12: 190 pg/mL — ABNORMAL LOW (ref 211–911)

## 2013-11-12 MED ORDER — PEG-KCL-NACL-NASULF-NA ASC-C 100 G PO SOLR: 1.0000 | Freq: Once | ORAL | Status: DC

## 2013-11-12 MED ORDER — OMEPRAZOLE 40 MG PO CPDR: DELAYED_RELEASE_CAPSULE | ORAL | Status: DC

## 2013-11-12 NOTE — Progress Notes (Signed)
    History of Present Illness: This is a 23 year old male previously followed by Dr. Sheryn Bisonavid Patterson. He has Barrett's esophagus and his reflux symptoms are controlled on omeprazole 40 mg daily. He has had chronic constipation for several years with intermittent episodes of crampy generalized abdominal pain that appears to be associated with his constipation. Constipation and associated abdominal pain has worsened. Occasionally he has needed to use enemas or take Mag Citrate. He's also had a low B12 and he did not start B12 replacement therapy at his primary care as office as Dr. Jarold MottoPatterson recommended last year. His father had inflammatory bowel disease. Dr. Jarold MottoPatterson has recommended colonoscopy last year and the patient has not scheduled. Denies weight loss, diarrhea, change in stool caliber, melena, hematochezia, nausea, vomiting, dysphagia, chest pain.  Current Medications, Allergies, Past Medical History, Past Surgical History, Family History and Social History were reviewed in [facility] electronic medical record.  Physical Exam: General: Well developed , well nourished, no acute distress Head: Normocephalic and atraumatic Eyes:  sclerae anicteric, EOMI Ears: Normal auditory acuity Mouth: No deformity or lesions Lungs: Clear throughout to auscultation Heart: Regular rate and rhythm; no murmurs, rubs or bruits Abdomen: Soft, non tender and non distended. No masses, hepatosplenomegaly or hernias noted. Normal Bowel sounds Rectal: deferred to colonoscopy Musculoskeletal: Symmetrical with no gross deformities  Pulses:  Normal pulses noted Extremities: No clubbing, cyanosis, edema or deformities noted Neurological: Alert oriented x 4, grossly nonfocal Psychological:  Alert and cooperative. Normal mood and affect  Assessment and Recommendations:  1. Chronic constipation. Intermittent generalized abdominal pain. Low B12. Father with IBD. R/O Crohn's disease. Repeat B12 and CBC today. If  his B12 remains low is advised to followup with his PCP for ongoing treatment. Schedule colonoscopy. The risks, benefits, and alternatives to colonoscopy with possible biopsy and possible polypectomy were discussed with the patient and they consent to proceed.   2. Barrett's esophagus. Continue omeprazole 40 mg daily and standard antireflux measures. Surveillance endoscopy is recommended in April 2017.

## 2013-11-12 NOTE — Patient Instructions (Signed)
Your physician has requested that you go to the basement for the following lab work before leaving today: CBC, Vitamin B12.  You have been scheduled for a colonoscopy with propofol. Please follow written instructions given to you at your visit today.  Please pick up your prep kit at the pharmacy within the next 1-3 days. If you use inhalers (even only as needed), please bring them with you on the day of your procedure. Your physician has requested that you go to www.startemmi.com and enter the access code given to you at your visit today. This web site gives a general overview about your procedure. However, you should still follow specific instructions given to you by our office regarding your preparation for the procedure.  Thank you for choosing me and [facility].  Pricilla Riffle. Dagoberto Ligas., MD., Marval Regal

## 2013-11-13 ENCOUNTER — Encounter: Payer: Self-pay | Admitting: Gastroenterology

## 2013-11-16 ENCOUNTER — Telehealth: Payer: Self-pay | Admitting: Gastroenterology

## 2013-11-16 NOTE — Telephone Encounter (Signed)
Patient notified of results.  I advised him that no phone numbers were listed for his chart, he did authorize me to put this phone number in his demographics.

## 2013-12-12 ENCOUNTER — Ambulatory Visit: Payer: BC Managed Care – PPO | Admitting: Internal Medicine

## 2014-01-10 ENCOUNTER — Ambulatory Visit (AMBULATORY_SURGERY_CENTER): Payer: BC Managed Care – PPO | Admitting: Gastroenterology

## 2014-01-10 ENCOUNTER — Encounter: Payer: Self-pay | Admitting: Gastroenterology

## 2014-01-10 VITALS — BP 125/68 | HR 82 | Temp 97.8°F | Resp 16 | Ht 67.5 in | Wt 147.0 lb

## 2014-01-10 DIAGNOSIS — R109 Unspecified abdominal pain: Secondary | ICD-10-CM

## 2014-01-10 DIAGNOSIS — K5900 Constipation, unspecified: Secondary | ICD-10-CM

## 2014-01-10 MED ORDER — SODIUM CHLORIDE 0.9 % IV SOLN: 500.0000 mL | INTRAVENOUS | Status: DC

## 2014-01-10 NOTE — Op Note (Signed)
[facility] [address] [place], [id number]   COLONOSCOPY PROCEDURE REPORT  PATIENT: Reginald ChartersDillon, [name]  MR#: [id number] BIRTHDATE: March 01, 1991 , 23  yrs. old GENDER: Male ENDOSCOPIST: Meryl DareMalcolm T Osie Merkin, MD, [facility] PROCEDURE DATE:  01/10/2014 PROCEDURE:   Colonoscopy, diagnostic First Screening Colonoscopy - Avg.  risk and is 50 yrs.  old or older - No.      History of Adenoma - Now for follow-up colonoscopy & has been > or = to 3 yrs.  N/A  Polyps Removed Today? No. Recommend repeat exam, <10 yrs? No. ASA CLASS:   Class I INDICATIONS:Abdominal pain and Constipation. MEDICATIONS: MAC sedation, administered by CRNA and propofol (Diprivan) 340mg  IV DESCRIPTION OF PROCEDURE:   After the risks benefits and alternatives of the procedure were thoroughly explained, informed consent was obtained.  A digital rectal exam revealed no abnormalities of the rectum.   The LB [id number]  endoscope was introduced through the anus and advanced to the terminal ileum which was intubated for a short distance. No adverse events experienced.   The quality of the prep was good, using MoviPrep The instrument was then slowly withdrawn as the colon was fully examined.  COLON FINDINGS: A normal appearing cecum, ileocecal valve, and appendiceal orifice were identified.  The ascending, hepatic flexure, transverse, splenic flexure, descending, sigmoid colon and rectum appeared unremarkable.  No polyps or cancers were seen. The mucosa appeared normal in the terminal ileum.  Retroflexed views revealed no abnormalities. The time to cecum=3 minutes 08 seconds.  Withdrawal time=9 minutes 31 seconds.  The scope was withdrawn and the procedure completed.  COMPLICATIONS: There were no complications.  ENDOSCOPIC IMPRESSION: 1.   Normal colon 2.   Normal mucosa in the terminal ileum  RECOMMENDATIONS: 1.  Call office for follow-up appointment in 1 year  eSigned:  Meryl DareMalcolm T Mahealani Sulak, MD, [facility]  01/10/2014 8:30 AM

## 2014-01-10 NOTE — Progress Notes (Signed)
Report to PACU, RN, vss, BBS= Clear.  

## 2014-01-10 NOTE — Patient Instructions (Signed)

## 2014-01-11 ENCOUNTER — Telehealth: Payer: Self-pay

## 2014-01-11 NOTE — Telephone Encounter (Signed)
  Follow up Call-  Call back number 01/10/2014 10/25/2012  Post procedure Call Back phone  # [phone] cell [phone]  Permission to leave phone message Yes Yes     Patient questions:  Do you have a fever, pain , or abdominal swelling? No. Pain Score  0 *  Have you tolerated food without any problems? Yes.    Have you been able to return to your normal activities? Yes.    Do you have any questions about your discharge instructions: Diet   No. Medications  No. Follow up visit  No.  Do you have questions or concerns about your Care? No.  Actions: * If pain score is 4 or above: No action needed, pain <4.   No problems per the pt. Maw

## 2014-02-12 ENCOUNTER — Telehealth: Payer: Self-pay | Admitting: Gastroenterology

## 2014-02-12 NOTE — Telephone Encounter (Signed)
Informed patient that he will have to ask his PCP for this medication. Pt verbalized understanding.

## 2014-03-13 ENCOUNTER — Encounter (INDEPENDENT_AMBULATORY_CARE_PROVIDER_SITE_OTHER): Payer: Self-pay

## 2014-03-13 ENCOUNTER — Ambulatory Visit: Payer: BC Managed Care – PPO | Admitting: Family Medicine

## 2014-03-25 ENCOUNTER — Ambulatory Visit (INDEPENDENT_AMBULATORY_CARE_PROVIDER_SITE_OTHER): Payer: BC Managed Care – PPO | Admitting: Family Medicine

## 2014-03-25 ENCOUNTER — Encounter: Payer: Self-pay | Admitting: Family Medicine

## 2014-03-25 VITALS — BP 125/79 | HR 81 | Temp 97.8°F | Ht 67.5 in | Wt 149.0 lb

## 2014-03-25 DIAGNOSIS — IMO0001 Reserved for inherently not codable concepts without codable children: Secondary | ICD-10-CM

## 2014-03-25 DIAGNOSIS — R51 Headache: Secondary | ICD-10-CM

## 2014-03-25 DIAGNOSIS — J029 Acute pharyngitis, unspecified: Secondary | ICD-10-CM

## 2014-03-25 DIAGNOSIS — R509 Fever, unspecified: Secondary | ICD-10-CM

## 2014-03-25 DIAGNOSIS — M791 Myalgia, unspecified site: Secondary | ICD-10-CM

## 2014-03-25 DIAGNOSIS — R112 Nausea with vomiting, unspecified: Secondary | ICD-10-CM

## 2014-03-25 DIAGNOSIS — B349 Viral infection, unspecified: Secondary | ICD-10-CM

## 2014-03-25 LAB — POCT CBC
GRANULOCYTE PERCENT: 83.9 % — AB (ref 37–80)
HCT, POC: 47.2 % (ref 43.5–53.7)
HEMOGLOBIN: 15.9 g/dL (ref 14.1–18.1)
LYMPH, POC: 1.1 (ref 0.6–3.4)
MCH, POC: 28.8 pg (ref 27–31.2)
MCHC: 33.7 g/dL (ref 31.8–35.4)
MCV: 85.6 fL (ref 80–97)
MPV: 7.9 fL (ref 0–99.8)
PLATELET COUNT, POC: 186 10*3/uL (ref 142–424)
POC Granulocyte: 7 — AB (ref 2–6.9)
POC LYMPH PERCENT: 13.1 %L (ref 10–50)
RBC: 5.5 M/uL (ref 4.69–6.13)
RDW, POC: 12.4 %
WBC: 8.3 10*3/uL (ref 4.6–10.2)

## 2014-03-25 LAB — POCT INFLUENZA A/B
INFLUENZA B, POC: NEGATIVE
Influenza A, POC: NEGATIVE

## 2014-03-25 LAB — POCT RAPID STREP A (OFFICE): Rapid Strep A Screen: NEGATIVE

## 2014-03-25 MED ORDER — PROMETHAZINE HCL 25 MG PO TABS: 25.0000 mg | ORAL_TABLET | Freq: Three times a day (TID) | ORAL | Status: DC | PRN

## 2014-03-25 NOTE — Patient Instructions (Signed)
Clear liquids for 24 hours (like 7-Up, ginger ale, Sprite, Jello, frozen pops) Full liquids the second 24-hours (like potato soup, tomato soup, chicken noodle soup) Bland diet the third 24-hours (boiled and baked foods, no fried or greasy foods) Avoid milk, cheese, ice cream and dairy products for 72 hours. Avoid caffeine (cola drinks, coffee, tea, Mountain Dew, Mellow Yellow) Take in small amounts, but frequently. Tylenol  as needed for aches pains and fever  

## 2014-03-25 NOTE — Progress Notes (Signed)
Subjective:    Patient ID: Reginald Mcdonald, male    DOB: 1990/07/22, 23 y.o.   MRN: [id number]  HPI Patient here today for flu like symptoms that started yesterday. The patient has had headache and myalgias along with fever head congestion and sore throat. He has also had nausea and vomiting. He denies diarrhea. He denies any family member having any symptoms like he is having.        Patient Active Problem List   Diagnosis Date Noted  . GERD (gastroesophageal reflux disease) 03/16/2011  . Barretts esophagus 03/16/2011  . Alopecia 03/16/2011   Outpatient Encounter Prescriptions as of 03/25/2014  Medication Sig  . ibuprofen (ADVIL,MOTRIN) 200 MG tablet Take 200 mg by mouth every 6 (six) hours as needed.  Marland Kitchen omeprazole (PRILOSEC) 40 MG capsule TAKE ONE CAPSULE BY MOUTH ONE TIME DAILY    Review of Systems  Constitutional: Positive for fever and fatigue.  HENT: Positive for congestion, ear pain and sore throat.   Eyes: Negative.   Respiratory: Negative.  Negative for cough.   Cardiovascular: Negative.   Gastrointestinal: Positive for nausea.  Endocrine: Negative.   Genitourinary: Negative.   Musculoskeletal: Positive for myalgias (legs feel weak).  Skin: Negative.   Allergic/Immunologic: Negative.   Neurological: Positive for headaches. Negative for dizziness.  Hematological: Negative.   Psychiatric/Behavioral: Negative.        Objective:   Physical Exam  Nursing note and vitals reviewed. Constitutional: He is oriented to person, place, and time. He appears well-developed and well-nourished. No distress.  HENT:  Head: Normocephalic and atraumatic.  Right Ear: External ear normal.  Left Ear: External ear normal.  Mouth/Throat: Oropharynx is clear and moist. No oropharyngeal exudate.  Slight nasal congestion left greater than right  Eyes: Conjunctivae and EOM are normal. Pupils are equal, round, and reactive to light. Right eye exhibits no discharge. Left eye exhibits no  discharge. No scleral icterus.  Neck: Normal range of motion. Neck supple. No tracheal deviation present. No thyromegaly present.  Cardiovascular: Normal rate, regular rhythm, normal heart sounds and intact distal pulses.  Exam reveals no gallop and no friction rub.   No murmur heard. Pulmonary/Chest: Effort normal and breath sounds normal. No respiratory distress. He has no wheezes. He has no rales. He exhibits no tenderness.  Abdominal: Soft. Bowel sounds are normal. He exhibits no mass. There is tenderness. There is no rebound and no guarding.  Tenderness right upper quadrant  Musculoskeletal: Normal range of motion. He exhibits no edema and no tenderness.  Lymphadenopathy:    He has cervical adenopathy.  Neurological: He is alert and oriented to person, place, and time.  Skin: Skin is warm and dry. No rash noted. No erythema. No pallor.  Psychiatric: He has a normal mood and affect. His behavior is normal. Judgment and thought content normal.   BP 125/79  Pulse 81  Temp(Src) 97.8 F (36.6 C) (Oral)  Ht 5' 7.5" (1.715 m)  Wt 149 lb (67.586 kg)  BMI 22.98 kg/m2 Results for orders placed in visit on 03/25/14  POCT RAPID STREP A (OFFICE)      Result Value Ref Range   Rapid Strep A Screen Negative  Negative  POCT INFLUENZA A/B      Result Value Ref Range   Influenza A, POC Negative     Influenza B, POC Negative    POCT CBC      Result Value Ref Range   WBC 8.3  4.6 - 10.2 K/uL  Lymph, poc 1.1  0.6 - 3.4   POC LYMPH PERCENT 13.1  10 - 50 %L   POC Granulocyte 7.0 (*) 2 - 6.9   Granulocyte percent 83.9 (*) 37 - 80 %G   RBC 5.5  4.69 - 6.13 M/uL   Hemoglobin 15.9  14.1 - 18.1 g/dL   HCT, POC 16.1  09.6 - 53.7 %   MCV 85.6  80 - 97 fL   MCH, POC 28.8  27 - 31.2 pg   MCHC 33.7  31.8 - 35.4 g/dL   RDW, POC 04.5     Platelet Count, POC 186.0  142 - 424 K/uL   MPV 7.9  0 - 99.8 fL          Assessment & Plan:  1. Fever, unspecified - POCT rapid strep A - Upper Respiratory  Culture, Routine - POCT Influenza A/B - POCT CBC - Hepatic function panel  2. Non-intractable vomiting with nausea, vomiting of unspecified type - POCT CBC - Hepatic function panel - promethazine (PHENERGAN) 25 MG tablet; Take 1 tablet (25 mg total) by mouth every 8 (eight) hours as needed for nausea or vomiting.  Dispense: 20 tablet; Refill: 0  3. Sore throat - POCT CBC - Hepatic function panel  4. Headache(784.0) - POCT CBC - Hepatic function panel  5. Myalgia - POCT CBC - Hepatic function panel  6. Viral syndrome - promethazine (PHENERGAN) 25 MG tablet; Take 1 tablet (25 mg total) by mouth every 8 (eight) hours as needed for nausea or vomiting.  Dispense: 20 tablet; Refill: 0  Patient Instructions  Clear liquids for 24 hours (like 7-Up, ginger ale, Sprite, Jello, frozen pops) Full liquids the second 24-hours (like potato soup, tomato soup, chicken noodle soup) Bland diet the third 24-hours (boiled and baked foods, no fried or greasy foods) Avoid milk, cheese, ice cream and dairy products for 72 hours. Avoid caffeine (cola drinks, coffee, tea, Mountain Dew, Mellow Yellow) Take in small amounts, but frequently. Tylenol  as needed for aches pains and fever    Nyra Capes MD

## 2014-03-26 LAB — HEPATIC FUNCTION PANEL
ALBUMIN: 5 g/dL (ref 3.5–5.5)
ALT: 25 IU/L (ref 0–44)
AST: 21 IU/L (ref 0–40)
Alkaline Phosphatase: 80 IU/L (ref 39–117)
Bilirubin, Direct: 0.14 mg/dL (ref 0.00–0.40)
Total Bilirubin: 0.5 mg/dL (ref 0.0–1.2)
Total Protein: 7.2 g/dL (ref 6.0–8.5)

## 2014-03-27 LAB — UPPER RESPIRATORY CULTURE, ROUTINE

## 2014-04-01 ENCOUNTER — Telehealth: Payer: Self-pay | Admitting: Family Medicine

## 2014-04-02 NOTE — Telephone Encounter (Signed)
Continues to have some nasal congestion. Nausea/vomiting has resolved. Advised to use saline nasal rinses and increase water intake. Can take pseudoephedrine 30mg  1-2 tablets every 4-6 hrs if desired. May be allergies or viral. Treat symptoms for a few more days and if they persist or worsen f/u. Patient stated understanding and agreement to plan.

## 2014-06-06 ENCOUNTER — Ambulatory Visit (INDEPENDENT_AMBULATORY_CARE_PROVIDER_SITE_OTHER): Payer: BC Managed Care – PPO | Admitting: Family Medicine

## 2014-06-06 ENCOUNTER — Encounter: Payer: Self-pay | Admitting: Family Medicine

## 2014-06-06 VITALS — BP 137/92 | HR 75 | Temp 98.1°F | Wt 153.0 lb

## 2014-06-06 DIAGNOSIS — L03317 Cellulitis of buttock: Secondary | ICD-10-CM

## 2014-06-06 MED ORDER — DOXYCYCLINE HYCLATE 100 MG PO TABS: 100.0000 mg | ORAL_TABLET | Freq: Two times a day (BID) | ORAL | Status: DC

## 2014-06-06 NOTE — Progress Notes (Signed)
   Subjective:    Patient ID: Reginald ChartersClint M Skillman, male    DOB: May 30, 1991, 23 y.o.   MRN: [id number]  HPI  C/o left buttock bug bite or cyst.  He is having this for a few days now  Review of Systems    No chest pain, SOB, HA, dizziness, vision change, N/V, diarrhea, constipation, dysuria, urinary urgency or frequency, myalgias, arthralgias or rash.  Objective:   Physical Exam  Skin - Left buttock with erythema 4cm diameter with central injection site.      Assessment & Plan:  Cellulitis of buttock - Plan: doxycycline (VIBRA-TABS) 100 MG tablet, DISCONTINUED: doxycycline (VIBRA-TABS) 100 MG tablet  Deatra CanterWilliam J Landis Dowdy FNP

## 2014-06-17 ENCOUNTER — Ambulatory Visit (INDEPENDENT_AMBULATORY_CARE_PROVIDER_SITE_OTHER): Payer: BC Managed Care – PPO | Admitting: Family Medicine

## 2014-06-17 ENCOUNTER — Encounter: Payer: Self-pay | Admitting: Family Medicine

## 2014-06-17 VITALS — BP 130/90 | HR 75 | Temp 97.9°F | Ht 67.5 in | Wt 149.6 lb

## 2014-06-17 DIAGNOSIS — T148 Other injury of unspecified body region: Secondary | ICD-10-CM

## 2014-06-17 DIAGNOSIS — W57XXXA Bitten or stung by nonvenomous insect and other nonvenomous arthropods, initial encounter: Secondary | ICD-10-CM

## 2014-06-17 MED ORDER — DOXYCYCLINE HYCLATE 100 MG PO TABS: 100.0000 mg | ORAL_TABLET | Freq: Two times a day (BID) | ORAL | Status: DC

## 2014-06-17 NOTE — Progress Notes (Signed)
   Subjective:    Patient ID: Reginald Mcdonald, male    DOB: 02/25/1991, 23 y.o.   MRN: [id number]  HPI C/o erythematous skin lesion on buttock he thinks is from a tick bite.  Review of Systems  Constitutional: Negative for fever.  HENT: Negative for ear pain.   Eyes: Negative for discharge.  Respiratory: Negative for cough.   Cardiovascular: Negative for chest pain.  Gastrointestinal: Negative for abdominal distention.  Endocrine: Negative for polyuria.  Genitourinary: Negative for difficulty urinating.  Musculoskeletal: Negative for gait problem and neck pain.  Skin: Negative for color change and rash.  Neurological: Negative for speech difficulty and headaches.  Psychiatric/Behavioral: Negative for agitation.       Objective:    BP 130/90 mmHg  Pulse 75  Temp(Src) 97.9 F (36.6 C) (Oral)  Ht 5' 7.5" (1.715 m)  Wt 149 lb 9.6 oz (67.858 kg)  BMI 23.07 kg/m2 Physical Exam   Skin - Erythematous skin lesion healing left buttock     Assessment & Plan:     ICD-9-CM ICD-10-CM   1. Tick bite 919.4 T14.8 doxycycline (VIBRA-TABS) 100 MG tablet   E906.4 W57.XXXA      No Follow-up on file.  Deatra CanterWilliam J Aireona Torelli FNP

## 2014-07-01 ENCOUNTER — Ambulatory Visit (INDEPENDENT_AMBULATORY_CARE_PROVIDER_SITE_OTHER): Payer: BC Managed Care – PPO | Admitting: Family Medicine

## 2014-07-01 ENCOUNTER — Encounter: Payer: Self-pay | Admitting: Family Medicine

## 2014-07-01 ENCOUNTER — Encounter (INDEPENDENT_AMBULATORY_CARE_PROVIDER_SITE_OTHER): Payer: Self-pay

## 2014-07-01 VITALS — BP 128/80 | HR 70 | Temp 98.8°F | Ht 67.5 in | Wt 151.0 lb

## 2014-07-01 DIAGNOSIS — J069 Acute upper respiratory infection, unspecified: Secondary | ICD-10-CM

## 2014-07-01 MED ORDER — AZITHROMYCIN 250 MG PO TABS: ORAL_TABLET | ORAL | Status: DC

## 2014-07-01 NOTE — Progress Notes (Signed)
   Subjective:    Patient ID: Reginald Mcdonald, male    DOB: [birth year], 23 y.o.   MRN: [id number]  HPI Patient is here for c/o congestion and uri sx's for over a week.  Review of Systems  Constitutional: Negative for fever.  HENT: Negative for ear pain.   Eyes: Negative for discharge.  Respiratory: Negative for cough.   Cardiovascular: Negative for chest pain.  Gastrointestinal: Negative for abdominal distention.  Endocrine: Negative for polyuria.  Genitourinary: Negative for difficulty urinating.  Musculoskeletal: Negative for gait problem and neck pain.  Skin: Negative for color change and rash.  Neurological: Negative for speech difficulty and headaches.  Psychiatric/Behavioral: Negative for agitation.       Objective:    BP 128/80 mmHg  Pulse 70  Temp(Src) 98.8 F (37.1 C) (Oral)  Ht 5' 7.5" (1.715 m)  Wt 151 lb (68.493 kg)  BMI 23.29 kg/m2 Physical Exam  Constitutional: He is oriented to person, place, and time. He appears well-developed and well-nourished.  HENT:  Head: Normocephalic and atraumatic.  Mouth/Throat: Oropharynx is clear and moist.  Eyes: Pupils are equal, round, and reactive to light.  Neck: Normal range of motion. Neck supple.  Cardiovascular: Normal rate and regular rhythm.   No murmur heard. Pulmonary/Chest: Effort normal and breath sounds normal.  Abdominal: Soft. Bowel sounds are normal. There is no tenderness.  Neurological: He is alert and oriented to person, place, and time.  Skin: Skin is warm and dry.  Psychiatric: He has a normal mood and affect.          Assessment & Plan:     ICD-9-CM ICD-10-CM   1. URI (upper respiratory infection) 465.9 J06.9 azithromycin (ZITHROMAX) 250 MG tablet     Return if symptoms worsen or fail to improve.  Deatra CanterWilliam J Oxford FNP

## 2014-10-29 ENCOUNTER — Encounter: Payer: Self-pay | Admitting: Gastroenterology

## 2015-01-22 ENCOUNTER — Telehealth: Payer: Self-pay | Admitting: Gastroenterology

## 2015-01-22 ENCOUNTER — Ambulatory Visit (INDEPENDENT_AMBULATORY_CARE_PROVIDER_SITE_OTHER): Payer: BLUE CROSS/BLUE SHIELD | Admitting: Gastroenterology

## 2015-01-22 ENCOUNTER — Encounter: Payer: Self-pay | Admitting: Gastroenterology

## 2015-01-22 VITALS — BP 110/70 | HR 60 | Ht 67.5 in | Wt 150.0 lb

## 2015-01-22 DIAGNOSIS — K219 Gastro-esophageal reflux disease without esophagitis: Secondary | ICD-10-CM | POA: Diagnosis not present

## 2015-01-22 DIAGNOSIS — K2270 Barrett's esophagus without dysplasia: Secondary | ICD-10-CM

## 2015-01-22 MED ORDER — OMEPRAZOLE 40 MG PO CPDR: 40.0000 mg | DELAYED_RELEASE_CAPSULE | Freq: Two times a day (BID) | ORAL | Status: DC

## 2015-01-22 NOTE — Patient Instructions (Signed)
We have sent the following medications to your pharmacy for you to pick up at your convenience:omeprazole 40 mg twice daily.  You will be due for a recall upper endoscopy in 10/2015. We will send you a reminder in the mail when it gets closer to that time.  Thank you for choosing me and [facility].  Venita LickMalcolm T. Pleas KochStark, Jr., MD., Clementeen GrahamFACG

## 2015-01-22 NOTE — Progress Notes (Signed)
    History of Present Illness: This is a 24 year old white male with a history of Barrett's esophagus. His reflux symptoms have not been under optimal control recently. He has frequent nighttime breakthrough symptoms a few nights per week and occasional postprandial symptoms. He ran out of medication for about 1 month and the symptoms worsened however since resuming medication his symptoms have not come under adequate control.  Current Medications, Allergies, Past Medical History, Past Surgical History, Family History and Social History were reviewed in [facility] electronic medical record.  Physical Exam: General: Well developed , well nourished, no acute distress Head: Normocephalic and atraumatic Eyes:  sclerae anicteric, EOMI Ears: Normal auditory acuity Mouth: No deformity or lesions Lungs: Clear throughout to auscultation Heart: Regular rate and rhythm; no murmurs, rubs or bruits Abdomen: Soft, non tender and non distended. No masses, hepatosplenomegaly or hernias noted. Normal Bowel sounds Musculoskeletal: Symmetrical with no gross deformities  Pulses:  Normal pulses noted Extremities: No clubbing, cyanosis, edema or deformities noted Neurological: Alert oriented x 4, grossly nonfocal Psychological:  Alert and cooperative. Normal mood and affect  Assessment and Recommendations:  1. Barrett's esophagus. GERD without adequate symptom control. Intensify antireflux measures. Increase omeprazole to 40 mg twice a day before meals. If his symptoms are not under very good control within 4-6 weeks he is advised to call to consider medication change. Surveillance endoscopy due April 2017.

## 2015-01-22 NOTE — Telephone Encounter (Signed)
Fixed to error on the prescription and [facility] to [facility]. Called and notified the pharmacy as well.

## 2015-09-02 ENCOUNTER — Encounter: Payer: Self-pay | Admitting: Gastroenterology

## 2015-09-10 ENCOUNTER — Encounter: Payer: Self-pay | Admitting: Gastroenterology

## 2015-10-23 ENCOUNTER — Ambulatory Visit (AMBULATORY_SURGERY_CENTER): Payer: Self-pay | Admitting: *Deleted

## 2015-10-23 VITALS — Ht 68.0 in | Wt 147.0 lb

## 2015-10-23 DIAGNOSIS — K2270 Barrett's esophagus without dysplasia: Secondary | ICD-10-CM

## 2015-10-23 NOTE — Progress Notes (Signed)
No egg or soy allergy. No anesthesia problems.  No home O2.  No diet meds.  

## 2015-11-07 ENCOUNTER — Ambulatory Visit (AMBULATORY_SURGERY_CENTER): Payer: BLUE CROSS/BLUE SHIELD | Admitting: Gastroenterology

## 2015-11-07 ENCOUNTER — Encounter: Payer: Self-pay | Admitting: Gastroenterology

## 2015-11-07 VITALS — BP 97/73 | HR 67 | Temp 99.1°F | Resp 20 | Ht 68.0 in | Wt 147.0 lb

## 2015-11-07 DIAGNOSIS — K2270 Barrett's esophagus without dysplasia: Secondary | ICD-10-CM | POA: Diagnosis not present

## 2015-11-07 DIAGNOSIS — K219 Gastro-esophageal reflux disease without esophagitis: Secondary | ICD-10-CM | POA: Diagnosis not present

## 2015-11-07 DIAGNOSIS — Z8719 Personal history of other diseases of the digestive system: Secondary | ICD-10-CM | POA: Diagnosis not present

## 2015-11-07 MED ORDER — SODIUM CHLORIDE 0.9 % IV SOLN: 500.0000 mL | INTRAVENOUS | Status: DC

## 2015-11-07 NOTE — Op Note (Signed)
[facility] Patient Name: Reginald Mcdonald Procedure Date: 11/07/2015 8:34 AM MRN: [id number] Endoscopist: Meryl Dare , MD Age: 25 Date of Birth: Sep 26, 1990 Gender: Male Procedure:                Upper GI endoscopy Indications:              Follow-up of Barrett's esophagus Medicines:                Monitored Anesthesia Care Procedure:                Pre-Anesthesia Assessment:                           - Prior to the procedure, a History and Physical                            was performed, and patient medications and                            allergies were reviewed. The patient's tolerance of                            previous anesthesia was also reviewed. The risks                            and benefits of the procedure and the sedation                            options and risks were discussed with the patient.                            All questions were answered, and informed consent                            was obtained. Prior Anticoagulants: The patient has                            taken no previous anticoagulant or antiplatelet                            agents. ASA Grade Assessment: II - A patient with                            mild systemic disease. After reviewing the risks                            and benefits, the patient was deemed in                            satisfactory condition to undergo the procedure.                           After obtaining informed consent, the endoscope was  passed under direct vision. Throughout the                            procedure, the patient's blood pressure, pulse, and                            oxygen saturations were monitored continuously. The                            Model GIF-[id number]) scope was introduced                            through the mouth, and advanced to the second part                            of duodenum. The upper GI endoscopy was        accomplished without difficulty. The patient                            tolerated the procedure well. Scope In: Scope Out: Findings:                 There were esophageal mucosal changes secondary to                            established short-segment Barrett's disease present                            in the distal esophagus. The maximum longitudinal                            extent of these mucosal changes was 2 cm in length.                            Mucosa was biopsied with a cold forceps for                            histology randomly at intervals of 1 cm.                           The exam of the esophagus was otherwise normal.                           A small hiatal hernia was present.                           The exam of the stomach was otherwise normal.                           The duodenal bulb and second portion of the                            duodenum were normal. Complications:            No immediate complications. Estimated Blood  Loss:     Estimated blood loss: none. Impression:               - Esophageal mucosal changes secondary to                            established short-segment Barrett's disease.                            Biopsied.                           - Small hiatal hernia. Recommendation:           - Patient has a contact number available for                            emergencies. The signs and symptoms of potential                            delayed complications were discussed with the                            patient. Return to normal activities tomorrow.                            Written discharge instructions were provided to the                            patient.                           - Resume previous diet.                           - Continue present medications.                           - Await pathology results.                           - Repeat upper endoscopy in 3 years for                            surveillance if  no dysplasia. Meryl DareMalcolm T Melane Windholz, MD 11/07/2015 8:46:54 AM This report has been signed electronically.

## 2015-11-07 NOTE — Progress Notes (Signed)
Called to room to assist during endoscopic procedure.  Patient ID and intended procedure confirmed with present staff. Received instructions for my participation in the procedure from the performing physician.  

## 2015-11-07 NOTE — Patient Instructions (Addendum)
YOU HAD AN ENDOSCOPIC PROCEDURE TODAY AT THE [facility]:   Refer to the procedure report that was given to you for any specific questions about what was found during the examination.  If the procedure report does not answer your questions, please call your gastroenterologist to clarify.  If you requested that your care partner not be given the details of your procedure findings, then the procedure report has been included in a sealed envelope for you to review at your convenience later.  YOU SHOULD EXPECT: Some feelings of bloating in the abdomen. Passage of more gas than usual.  Walking can help get rid of the air that was put into your GI tract during the procedure and reduce the bloating. If you had a lower endoscopy (such as a colonoscopy or flexible sigmoidoscopy) you may notice spotting of blood in your stool or on the toilet paper. If you underwent a bowel prep for your procedure, you may not have a normal bowel movement for a few days.  Please Note:  You might notice some irritation and congestion in your nose or some drainage.  This is from the oxygen used during your procedure.  There is no need for concern and it should clear up in a day or so.  SYMPTOMS TO REPORT IMMEDIATELY:    Following upper endoscopy (EGD)  Vomiting of blood or coffee ground material  New chest pain or pain under the shoulder blades  Painful or persistently difficult swallowing  New shortness of breath  Fever of 100F or higher  Black, tarry-looking stools  For urgent or emergent issues, a gastroenterologist can be reached at any hour by calling [phone].   DIET: Your first meal following the procedure should be a small meal and then it is ok to progress to your normal diet. Heavy or fried foods are harder to digest and may make you feel nauseous or bloated.  Likewise, meals heavy in dairy and vegetables can increase bloating.  Drink plenty of fluids but you should avoid alcoholic beverages  for 24 hours.  ACTIVITY:  You should plan to take it easy for the rest of today and you should NOT DRIVE or use heavy machinery until tomorrow (because of the sedation medicines used during the test).    FOLLOW UP: Our staff will call the number listed on your records the next business day following your procedure to check on you and address any questions or concerns that you may have regarding the information given to you following your procedure. If we do not reach you, we will leave a message.  However, if you are feeling well and you are not experiencing any problems, there is no need to return our call.  We will assume that you have returned to your regular daily activities without incident.  If any biopsies were taken you will be contacted by phone or by letter within the next 1-3 weeks.  Please call us at [phone] if you have not heard about the biopsies in 3 weeks.    SIGNATURES/CONFIDENTIALITY: You and/or your care partner have signed paperwork which will be entered into your electronic medical record.  These signatures attest to the fact that that the information above on your After Visit Summary has been reviewed and is understood.  Full responsibility of the confidentiality of this discharge information lies with you and/or your care-partner.  Barrett's esophagus information given.  Hiatal hernia information given.  Repeat in 3 years for surveilance if no dysplasia.

## 2015-11-07 NOTE — Progress Notes (Signed)
Report to PACU, RN, vss, BBS= Clear.  

## 2015-11-10 ENCOUNTER — Telehealth: Payer: Self-pay

## 2015-11-10 NOTE — Telephone Encounter (Signed)
  Follow up Call-  Call back number 11/07/2015 01/10/2014  Post procedure Call Back phone  # [phone] cell  Permission to leave phone message Yes Yes    Patient was called for follow up after his procedure on 11/07/2015. No answer at the number given for follow up phone call. A message was left on the answering machine.

## 2015-11-17 ENCOUNTER — Encounter: Payer: Self-pay | Admitting: Gastroenterology

## 2016-04-01 ENCOUNTER — Encounter: Payer: Self-pay | Admitting: Family Medicine

## 2016-04-01 ENCOUNTER — Ambulatory Visit (INDEPENDENT_AMBULATORY_CARE_PROVIDER_SITE_OTHER): Payer: BLUE CROSS/BLUE SHIELD | Admitting: Family Medicine

## 2016-04-01 VITALS — BP 122/80 | HR 70 | Temp 98.0°F | Ht 68.0 in | Wt 148.0 lb

## 2016-04-01 DIAGNOSIS — H6092 Unspecified otitis externa, left ear: Secondary | ICD-10-CM

## 2016-04-01 MED ORDER — CIPROFLOXACIN-DEXAMETHASONE 0.3-0.1 % OT SUSP: 4.0000 [drp] | Freq: Two times a day (BID) | OTIC | 0 refills | Status: DC

## 2016-04-01 NOTE — Progress Notes (Signed)
BP 122/80   Pulse 70   Temp 98 F (36.7 C) (Oral)   Ht 5\' 8"  (1.727 m)   Wt 148 lb (67.1 kg)   BMI 22.50 kg/m    Subjective:    Patient ID: Reginald ChartersClint M Mcdonald, male    DOB: [birth year], 25 y.o.   MRN: [id number]  HPI: Reginald Mcdonald is a 25 y.o. male presenting on 04/01/2016 for Left ear pain (patient concerned because he is going out of town tomorrow for work)   HPI Left ear pain Patient is having left ear pain and drainage. He denies any fevers or chills or congestion. The left ear pain just started yesterday. He is mostly concerned because he is going on town tomorrow and with his travels is concerned we will be able to get anywhere to have helped. He denies any shortness of breath or wheezing.  Relevant past medical, surgical, family and social history reviewed and updated as indicated. Interim medical history since our last visit reviewed. Allergies and medications reviewed and updated.  Review of Systems  Constitutional: Negative for chills and fever.  HENT: Positive for ear discharge and ear pain. Negative for congestion, postnasal drip, rhinorrhea, sinus pressure and sneezing.   Eyes: Negative for discharge.  Respiratory: Negative for cough, shortness of breath and wheezing.   Cardiovascular: Negative for chest pain and leg swelling.  Musculoskeletal: Negative for back pain and gait problem.  Skin: Negative for rash.  All other systems reviewed and are negative.   Per HPI unless specifically indicated above     Medication List       Accurate as of 04/01/16  3:42 PM. Always use your most recent med list.          ciprofloxacin-dexamethasone otic suspension Commonly known as:  CIPRODEX Place 4 drops into the left ear 2 (two) times daily. Use for 7 days   omeprazole 40 MG capsule Commonly known as:  PRILOSEC Take 1 capsule (40 mg total) by mouth 2 (two) times daily.          Objective:    BP 122/80   Pulse 70   Temp 98 F (36.7 C) (Oral)   Ht 5\' 8"   (1.727 m)   Wt 148 lb (67.1 kg)   BMI 22.50 kg/m   Wt Readings from Last 3 Encounters:  04/01/16 148 lb (67.1 kg)  11/07/15 147 lb (66.7 kg)  10/23/15 147 lb (66.7 kg)    Physical Exam  Constitutional: He is oriented to person, place, and time. He appears well-developed and well-nourished. No distress.  HENT:  Right Ear: Tympanic membrane and ear canal normal.  Left Ear: Tympanic membrane normal. There is drainage and swelling. Tympanic membrane is not perforated, not erythematous and not retracted.  No middle ear effusion.  Nose: Nose normal.  Mouth/Throat: Uvula is midline, oropharynx is clear and moist and mucous membranes are normal.  Eyes: Conjunctivae are normal. Right eye exhibits no discharge. Left eye exhibits no discharge. No scleral icterus.  Cardiovascular: Normal rate, regular rhythm, normal heart sounds and intact distal pulses.   No murmur heard. Pulmonary/Chest: Effort normal and breath sounds normal. No respiratory distress. He has no wheezes. He has no rales.  Musculoskeletal: Normal range of motion. He exhibits no edema.  Neurological: He is alert and oriented to person, place, and time. Coordination normal.  Skin: Skin is warm and dry. No rash noted. He is not diaphoretic.  Psychiatric: He has a normal mood and affect. His behavior  is normal.  Nursing note and vitals reviewed.     Assessment & Plan:   Problem List Items Addressed This Visit    None    Visit Diagnoses    Left otitis externa    -  Primary   Relevant Medications   ciprofloxacin-dexamethasone (CIPRODEX) otic suspension       Follow up plan: Return if symptoms worsen or fail to improve.  Counseling provided for all of the vaccine components No orders of the defined types were placed in this encounter.   Arville Care, MD [facility] 04/01/2016, 3:42 PM

## 2016-04-02 ENCOUNTER — Telehealth: Payer: Self-pay | Admitting: Family Medicine

## 2016-04-02 MED ORDER — AZITHROMYCIN 250 MG PO TABS: ORAL_TABLET | ORAL | 0 refills | Status: DC

## 2016-04-02 NOTE — Telephone Encounter (Signed)
Patient aware.

## 2016-04-02 NOTE — Telephone Encounter (Signed)
Go ahead and call him in a Z-Pak, 250 mg, take 2 on the first day and then one each day for 5 days total.

## 2016-04-16 ENCOUNTER — Telehealth: Payer: Self-pay | Admitting: Family Medicine

## 2016-04-16 ENCOUNTER — Other Ambulatory Visit: Payer: Self-pay

## 2016-04-16 MED ORDER — AZITHROMYCIN 250 MG PO TABS: ORAL_TABLET | ORAL | 0 refills | Status: DC

## 2016-04-16 NOTE — Telephone Encounter (Signed)
Rx sent to pharmacy. Patient notified. 

## 2016-04-16 NOTE — Telephone Encounter (Signed)
[facility] [place]. [facility]

## 2016-04-16 NOTE — Telephone Encounter (Signed)
You can refill it 

## 2016-06-30 ENCOUNTER — Encounter: Payer: Self-pay | Admitting: Family Medicine

## 2016-06-30 ENCOUNTER — Ambulatory Visit (INDEPENDENT_AMBULATORY_CARE_PROVIDER_SITE_OTHER): Payer: BLUE CROSS/BLUE SHIELD | Admitting: Family Medicine

## 2016-06-30 ENCOUNTER — Telehealth: Payer: Self-pay | Admitting: Family Medicine

## 2016-06-30 VITALS — BP 132/78 | HR 113 | Temp 98.5°F | Ht 68.0 in | Wt 150.2 lb

## 2016-06-30 DIAGNOSIS — A084 Viral intestinal infection, unspecified: Secondary | ICD-10-CM | POA: Diagnosis not present

## 2016-06-30 MED ORDER — ONDANSETRON 4 MG PO TBDP: 4.0000 mg | ORAL_TABLET | Freq: Three times a day (TID) | ORAL | 0 refills | Status: DC | PRN

## 2016-06-30 NOTE — Progress Notes (Signed)
BP 132/78   Pulse (!) 113   Temp 98.5 F (36.9 C) (Oral)   Ht 5\' 8"  (1.727 m)   Wt 150 lb 3.2 oz (68.1 kg)   BMI 22.84 kg/m    Subjective:    Patient ID: Reginald ChartersClint M Lady, male    DOB: 08-29-1990, 25 y.o.   MRN: [id number]  HPI: Reginald ChartersClint M Mcmanamon is a 25 y.o. male presenting on 06/30/2016 for Diarrhea (Started at 12:30 this morning.); Emesis; and Fatigue   HPI Vomiting and fatigue and diarrhea and aches Patient has been having vomiting and fatigue and diarrhea and body aches that started at 12:30 AM this morning. He had 4 episodes of vomiting and then has had some diarrhea through the day but no further vomiting. He denies any fevers or chills but has had some body aches and soreness and fatigue associated with it. He denies any blood in his vomit or diarrhea. He says that there have been some sick contacts at work with similar symptoms.  Relevant past medical, surgical, family and social history reviewed and updated as indicated. Interim medical history since our last visit reviewed. Allergies and medications reviewed and updated.  Review of Systems  Constitutional: Negative for chills and fever.  Eyes: Negative for discharge.  Respiratory: Negative for shortness of breath and wheezing.   Cardiovascular: Negative for chest pain and leg swelling.  Gastrointestinal: Positive for abdominal pain, diarrhea, nausea and vomiting. Negative for abdominal distention, blood in stool and constipation.  Genitourinary: Negative for decreased urine volume.  Musculoskeletal: Negative for back pain and gait problem.  Skin: Negative for rash.  All other systems reviewed and are negative.   Per HPI unless specifically indicated above     Objective:    BP 132/78   Pulse (!) 113   Temp 98.5 F (36.9 C) (Oral)   Ht 5\' 8"  (1.727 m)   Wt 150 lb 3.2 oz (68.1 kg)   BMI 22.84 kg/m   Wt Readings from Last 3 Encounters:  06/30/16 150 lb 3.2 oz (68.1 kg)  04/01/16 148 lb (67.1 kg)  11/07/15 147  lb (66.7 kg)    Physical Exam  Constitutional: He is oriented to person, place, and time. He appears well-developed and well-nourished. No distress.  Eyes: Conjunctivae are normal. Right eye exhibits no discharge. Left eye exhibits no discharge. No scleral icterus.  Cardiovascular: Normal rate, regular rhythm, normal heart sounds and intact distal pulses.   No murmur heard. Pulmonary/Chest: Effort normal and breath sounds normal. No respiratory distress. He has no wheezes. He has no rales.  Abdominal: Soft. Bowel sounds are normal. He exhibits no distension. There is no tenderness (No tenderness on exam). There is no rebound.  Musculoskeletal: Normal range of motion. He exhibits no edema.  Neurological: He is alert and oriented to person, place, and time. Coordination normal.  Skin: Skin is warm and dry. No rash noted. He is not diaphoretic.  Psychiatric: He has a normal mood and affect. His behavior is normal.  Nursing note and vitals reviewed.     Assessment & Plan:   Problem List Items Addressed This Visit    None    Visit Diagnoses    Viral gastroenteritis    -  Primary   Relevant Medications   ondansetron (ZOFRAN-ODT) 4 MG disintegrating tablet       Follow up plan: Return if symptoms worsen or fail to improve.  Counseling provided for all of the vaccine components No orders of the defined  types were placed in this encounter.   Arville CareJoshua Dettinger, MD [facility] 06/30/2016, 5:07 PM

## 2016-06-30 NOTE — Telephone Encounter (Signed)
Spoke with pt's mother appt scheduled

## 2016-08-06 ENCOUNTER — Telehealth: Payer: Self-pay | Admitting: Gastroenterology

## 2016-08-06 MED ORDER — OMEPRAZOLE 40 MG PO CPDR: 40.0000 mg | DELAYED_RELEASE_CAPSULE | Freq: Two times a day (BID) | ORAL | 1 refills | Status: DC

## 2016-08-06 NOTE — Telephone Encounter (Signed)
Prescription sent to patient's pharmacy and patient notified to keep appt for any further refills. 

## 2016-08-06 NOTE — Telephone Encounter (Signed)
Left a message for patient to return my call. 

## 2016-08-06 NOTE — Telephone Encounter (Signed)
Appointment scheduled.

## 2016-09-20 ENCOUNTER — Ambulatory Visit: Payer: BLUE CROSS/BLUE SHIELD | Admitting: Gastroenterology

## 2016-11-03 ENCOUNTER — Ambulatory Visit: Payer: BLUE CROSS/BLUE SHIELD | Admitting: Gastroenterology

## 2016-11-03 ENCOUNTER — Telehealth: Payer: Self-pay | Admitting: Gastroenterology

## 2016-11-03 NOTE — Telephone Encounter (Signed)
yes

## 2016-11-03 NOTE — Telephone Encounter (Signed)
Do you want to charge? 

## 2016-12-13 ENCOUNTER — Telehealth: Payer: Self-pay | Admitting: Gastroenterology

## 2016-12-13 ENCOUNTER — Ambulatory Visit: Payer: BLUE CROSS/BLUE SHIELD | Admitting: Gastroenterology

## 2016-12-13 MED ORDER — OMEPRAZOLE 40 MG PO CPDR
40.0000 mg | DELAYED_RELEASE_CAPSULE | Freq: Two times a day (BID) | ORAL | 0 refills | Status: DC
Start: 2016-12-13 — End: 2017-01-12

## 2016-12-13 NOTE — Telephone Encounter (Signed)
Informed patient that he needs to keep his appointments in the future so we do not have to discharge him from the practice. Patient verbalized understanding and states he rescheduled for July. I informed him that we will send refills until then but to keep his appointment.

## 2016-12-13 NOTE — Telephone Encounter (Signed)
Charge and he will need to find a way to keep his appts with us. 1 more late cancellation or no show will lead to discharge from our practice.

## 2016-12-13 NOTE — Telephone Encounter (Signed)
Do you want to charge? 

## 2017-01-12 ENCOUNTER — Other Ambulatory Visit: Payer: Self-pay

## 2017-01-12 MED ORDER — OMEPRAZOLE 40 MG PO CPDR: 40.0000 mg | DELAYED_RELEASE_CAPSULE | Freq: Two times a day (BID) | ORAL | 0 refills | Status: DC

## 2017-01-18 ENCOUNTER — Ambulatory Visit (INDEPENDENT_AMBULATORY_CARE_PROVIDER_SITE_OTHER): Payer: BLUE CROSS/BLUE SHIELD | Admitting: Gastroenterology

## 2017-01-18 ENCOUNTER — Encounter: Payer: Self-pay | Admitting: Gastroenterology

## 2017-01-18 VITALS — BP 112/68 | HR 60 | Ht 68.0 in | Wt 149.2 lb

## 2017-01-18 DIAGNOSIS — R1110 Vomiting, unspecified: Secondary | ICD-10-CM

## 2017-01-18 DIAGNOSIS — K2270 Barrett's esophagus without dysplasia: Secondary | ICD-10-CM

## 2017-01-18 DIAGNOSIS — IMO0001 Reserved for inherently not codable concepts without codable children: Secondary | ICD-10-CM

## 2017-01-18 MED ORDER — OMEPRAZOLE 40 MG PO CPDR: 40.0000 mg | DELAYED_RELEASE_CAPSULE | Freq: Two times a day (BID) | ORAL | 11 refills | Status: DC

## 2017-01-18 NOTE — Progress Notes (Signed)
    History of Present Illness: This is a 26 year old male with history of short segment Barrett's esophagus. Last EGD performed in 11/2015 showed Barrett's without dysplasia. His reflux symptoms are generally under good control although he has had a few episodes of nocturnal regurgitation he relates to eating foods with barbecue sauce. He has avoided eating barbecue sauce recently and no longer has nocturnal regurgitation.  Current Medications, Allergies, Past Medical History, Past Surgical History, Family History and Social History were reviewed in [facility] electronic medical record.  Physical Exam: General: Well developed, well nourished, no acute distress Head: Normocephalic and atraumatic Eyes:  sclerae anicteric, EOMI Ears: Normal auditory acuity Mouth: No deformity or lesions Lungs: Clear throughout to auscultation Heart: Regular rate and rhythm; no murmurs, rubs or bruits Abdomen: Soft, non tender and non distended. No masses, hepatosplenomegaly or hernias noted. Normal Bowel sounds Musculoskeletal: Symmetrical with no gross deformities  Pulses:  Normal pulses noted Extremities: No clubbing, cyanosis, edema or deformities noted Neurological: Alert oriented x 4, grossly nonfocal Psychological:  Alert and cooperative. Normal mood and affect  Assessment and Recommendations:  1. Barrett's esophagus, short segment, without dysplasia. Continue omeprazole 40 mg daily and standard antireflux issues. Avoid foods that trigger symptoms. If nocturnal symptoms recur elevate head of bed 4 inches and call for further advice. Return office visit in 1 year. Surveillance EGD and a 3 year interval in May 2020.

## 2017-01-18 NOTE — Patient Instructions (Signed)
We have sent the following medications to your pharmacy for you to pick up at your convenience:omeprazole.  Thank you for choosing me and [facility].  Malcolm T. Stark, Jr., MD., FACG   

## 2017-02-01 ENCOUNTER — Telehealth: Payer: Self-pay | Admitting: Gastroenterology

## 2017-02-01 ENCOUNTER — Telehealth: Payer: Self-pay | Admitting: Family Medicine

## 2017-02-01 NOTE — Telephone Encounter (Signed)
Wants to go back on med for hair GI that prescribed it has retired Pt has not been seen since Dec appt made

## 2017-02-01 NOTE — Telephone Encounter (Signed)
Informed patient we have never prescribed this medication for him. Patient states Dr. Jarold MottoPatterson did a long time a ago and has been forgetting to mention this when he saw Dr. Russella DarStark in the office recently. Informed patient he will unfortunately will have to contact his PCP to fill this medication. Patient verbalized understanding.

## 2017-02-03 ENCOUNTER — Encounter: Payer: Self-pay | Admitting: Family Medicine

## 2017-02-03 ENCOUNTER — Ambulatory Visit (INDEPENDENT_AMBULATORY_CARE_PROVIDER_SITE_OTHER): Payer: BLUE CROSS/BLUE SHIELD | Admitting: Family Medicine

## 2017-02-03 DIAGNOSIS — L649 Androgenic alopecia, unspecified: Secondary | ICD-10-CM | POA: Insufficient documentation

## 2017-02-03 MED ORDER — FINASTERIDE 1 MG PO TABS: 1.0000 mg | ORAL_TABLET | Freq: Every day | ORAL | 11 refills | Status: DC

## 2017-02-03 NOTE — Progress Notes (Signed)
   BP 113/71   Pulse 66   Temp 99.8 F (37.7 C) (Oral)   Ht 5\' 8"  (1.727 m)   Wt 149 lb (67.6 kg)   BMI 22.66 kg/m    Subjective:    Patient ID: Reginald Mcdonald, male    DOB: Jun 06, 1991, 26 y.o.   MRN: [id number]  HPI: Reginald Mcdonald is a 26 y.o. 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 He says his mood mother's dad is almost completely bald  That is likely where is getting it from. He had been on finasteride previously which was helping until he stopped it and then it came back with a vengeance. He would like to go back on that. He denied having any sexual side effects while he Was on it.  Relevant past medical, surgical, family and social history reviewed and updated as indicated. Interim medical history since our last visit reviewed. Allergies and medications reviewed and updated.  Review of Systems  Constitutional: Negative for chills and fever.  Eyes: Negative for discharge.  Respiratory: Negative for shortness of breath and wheezing.   Cardiovascular: Negative for chest pain and leg swelling.  Musculoskeletal: Negative for back pain and gait problem.  Skin: Negative for rash.  All other systems reviewed and are negative.   Per HPI unless specifically indicated above        Objective:    BP 113/71   Pulse 66   Temp 99.8 F (37.7 C) (Oral)   Ht 5\' 8"  (1.727 m)   Wt 149 lb (67.6 kg)   BMI 22.66 kg/m   Wt Readings from Last 3 Encounters:  02/03/17 149 lb (67.6 kg)  01/18/17 149 lb 3.2 oz (67.7 kg)  06/30/16 150 lb 3.2 oz (68.1 kg)    Physical Exam  Constitutional: He is oriented to person, place, and time. He appears well-developed and well-nourished. No distress.  HENT:  Patient has started a widow speak and thinning of his hair on top of his head. There is  no erythema or rash  Eyes: Conjunctivae are normal. No scleral icterus.  Cardiovascular: Normal rate, regular rhythm, normal heart sounds and intact distal pulses.   No murmur heard. Pulmonary/Chest: Effort normal and breath sounds normal. No respiratory distress. He has no wheezes.  Musculoskeletal: Normal range of motion.  Neurological: He is alert and oriented to person, place, and time. Coordination normal.  Skin: Skin is warm and dry. No rash noted. He is not diaphoretic.  Psychiatric: He has a normal mood and affect. His behavior is normal.  Nursing note and vitals reviewed.       Assessment & Plan:   Problem List Items Addressed This Visit      Endocrine   Male pattern baldness   Relevant Medications   finasteride (PROPECIA) 1 MG tablet       Follow up plan: Return if symptoms worsen or fail to improve.  Counseling provided for all of the vaccine components No orders of the defined types were placed in this encounter.   Arville CareJoshua Xariah Silvernail, MD [facility] 02/03/2017, 8:29 AM

## 2017-03-01 ENCOUNTER — Encounter: Payer: Self-pay | Admitting: Family Medicine

## 2017-03-01 ENCOUNTER — Ambulatory Visit (INDEPENDENT_AMBULATORY_CARE_PROVIDER_SITE_OTHER): Payer: BLUE CROSS/BLUE SHIELD | Admitting: Family Medicine

## 2017-03-01 VITALS — BP 118/76 | HR 80 | Temp 100.3°F | Ht 68.0 in | Wt 149.0 lb

## 2017-03-01 DIAGNOSIS — J0110 Acute frontal sinusitis, unspecified: Secondary | ICD-10-CM

## 2017-03-01 MED ORDER — PSEUDOEPHEDRINE-GUAIFENESIN ER 120-1200 MG PO TB12: 1.0000 | ORAL_TABLET | Freq: Two times a day (BID) | ORAL | 0 refills | Status: DC

## 2017-03-01 MED ORDER — AMOXICILLIN-POT CLAVULANATE 875-125 MG PO TABS
1.0000 | ORAL_TABLET | Freq: Two times a day (BID) | ORAL | 0 refills | Status: DC
Start: 2017-03-01 — End: 2017-04-13

## 2017-03-01 NOTE — Progress Notes (Signed)
Chief Complaint  Patient presents with  . Sinus Problem    pt here today c/o pressure behind right eye, throat tickle and nausea.    HPI  Patient presents today for Symptoms include congestion, facial pain primarily around the right eye and right forehead, nasal congestion, non productive cough, post nasal drip and sinus pressure. There is no fever, chills, or sweats. Onset of symptoms was 3 days ago, gradually worsening since that time.    PMH: Smoking status noted ROS: Per HPI  Objective: BP 118/76   Pulse 80   Temp 100.3 F (37.9 C) (Oral)   Ht 5\' 8"  (1.727 m)   Wt 149 lb (67.6 kg)   BMI 22.66 kg/m  Gen: NAD, alert, cooperative with exam HEENT: NCAT, EOMI, PERRL CV: RRR, good S1/S2, no murmur Resp: CTABL, no wheezes, non-labored Abd: SNTND, BS present, no guarding or organomegaly Ext: No edema, warm Neuro: Alert and oriented, No gross deficits  Assessment and plan:  1. Acute non-recurrent frontal sinusitis     Meds ordered this encounter  Medications  . amoxicillin-clavulanate (AUGMENTIN) 875-125 MG tablet    Sig: Take 1 tablet by mouth 2 (two) times daily. Take all of this medication    Dispense:  20 tablet    Refill:  0  . Pseudoephedrine-Guaifenesin [id number] MG TB12    Sig: Take 1 tablet by mouth 2 (two) times daily. For congestion    Dispense:  20 each    Refill:  0    No orders of the defined types were placed in this encounter.   Follow up as needed.  Mechele Claude, MD

## 2017-03-10 ENCOUNTER — Other Ambulatory Visit: Payer: Self-pay | Admitting: Gastroenterology

## 2017-03-23 DIAGNOSIS — S0502XS Injury of conjunctiva and corneal abrasion without foreign body, left eye, sequela: Secondary | ICD-10-CM | POA: Diagnosis not present

## 2017-03-23 DIAGNOSIS — T1512XA Foreign body in conjunctival sac, left eye, initial encounter: Secondary | ICD-10-CM | POA: Diagnosis not present

## 2017-04-13 ENCOUNTER — Encounter: Payer: Self-pay | Admitting: Family Medicine

## 2017-04-13 ENCOUNTER — Ambulatory Visit (INDEPENDENT_AMBULATORY_CARE_PROVIDER_SITE_OTHER): Payer: BLUE CROSS/BLUE SHIELD | Admitting: Family Medicine

## 2017-04-13 VITALS — BP 122/76 | HR 73 | Temp 98.7°F | Ht 68.0 in | Wt 149.0 lb

## 2017-04-13 DIAGNOSIS — J029 Acute pharyngitis, unspecified: Secondary | ICD-10-CM

## 2017-04-13 DIAGNOSIS — H66001 Acute suppurative otitis media without spontaneous rupture of ear drum, right ear: Secondary | ICD-10-CM | POA: Diagnosis not present

## 2017-04-13 LAB — RAPID STREP SCREEN (MED CTR MEBANE ONLY): Strep Gp A Ag, IA W/Reflex: NEGATIVE

## 2017-04-13 LAB — CULTURE, GROUP A STREP

## 2017-04-13 MED ORDER — AMOXICILLIN-POT CLAVULANATE 875-125 MG PO TABS: 1.0000 | ORAL_TABLET | Freq: Two times a day (BID) | ORAL | 0 refills | Status: DC

## 2017-04-13 MED ORDER — LIDOCAINE VISCOUS 2 % MT SOLN: OROMUCOSAL | 0 refills | Status: DC

## 2017-04-13 NOTE — Patient Instructions (Signed)
I have prescribed you an antibiotic for what appears to be a ear infection on the right side. I suspect that the sore throat is related to the pressure on your eustachian tube from the ear infection. I provided a lidocaine gargle 3 to use every 3 hours as needed for sore throat. Continue Tylenol as needed for fevers. If your symptoms worsen or show no improvement after 2 days on antibiotics, please return for reevaluation.   Otitis Media, Adult Otitis media occurs when there is inflammation and fluid in the middle ear. Your middle ear is a part of the ear that contains bones for hearing as well as air that helps send sounds to your brain. What are the causes? This condition is caused by a blockage in the eustachian tube. This tube drains fluid from the ear to the back of the nose (nasopharynx). A blockage in this tube can be caused by an object or by swelling (edema) in the tube. Problems that can cause a blockage include:  A cold or other upper respiratory infection.  Allergies.  An irritant, such as tobacco smoke.  Enlarged adenoids. The adenoids are areas of soft tissue located high in the back of the throat, behind the nose and the roof of the mouth.  A mass in the nasopharynx.  Damage to the ear caused by pressure changes (barotrauma).  What are the signs or symptoms? Symptoms of this condition include:  Ear pain.  A fever.  Decreased hearing.  A headache.  Tiredness (lethargy).  Fluid leaking from the ear.  Ringing in the ear.  How is this diagnosed? This condition is diagnosed with a physical exam. During the exam your health care provider will use an instrument called an otoscope to look into your ear and check for redness, swelling, and fluid. He or she will also ask about your symptoms. Your health care provider may also order tests, such as:  A test to check the movement of the eardrum (pneumatic otoscopy). This test is done by squeezing a small amount of air into  the ear.  A test that changes air pressure in the middle ear to check how well the eardrum moves and whether the eustachian tube is working (tympanogram).  How is this treated? This condition usually goes away on its own within 3-5 days. But if the condition is caused by a bacteria infection and does not go away own its own, or keeps coming back, your health care provider may:  Prescribe antibiotic medicines to treat the infection.  Prescribe or recommend medicines to control pain.  Follow these instructions at home:  Take over-the-counter and prescription medicines only as told by your health care provider.  If you were prescribed an antibiotic medicine, take it as told by your health care provider. Do not stop taking the antibiotic even if you start to feel better.  Keep all follow-up visits as told by your health care provider. This is important. Contact a health care provider if:  You have bleeding from your nose.  There is a lump on your neck.  You are not getting better in 5 days.  You feel worse instead of better. Get help right away if:  You have severe pain that is not controlled with medicine.  You have swelling, redness, or pain around your ear.  You have stiffness in your neck.  A part of your face is paralyzed.  The bone behind your ear (mastoid) is tender when you touch it.  You develop  a severe headache. Summary  Otitis media is redness, soreness, and swelling of the middle ear.  This condition usually goes away on its own within 3-5 days.  If the problem does not go away in 3-5 days, your health care provider may prescribe or recommend medicines to treat your symptoms.  If you were prescribed an antibiotic medicine, take it as told by your health care provider. This information is not intended to replace advice given to you by your health care provider. Make sure you discuss any questions you have with your health care provider. Document Released:  03/26/2004 Document Revised: 06/11/2016 Document Reviewed: 06/11/2016 Elsevier Interactive Patient Education  2017 [employer].

## 2017-04-13 NOTE — Progress Notes (Signed)
Subjective: CC: ear pain, sore throat PCP: Mechele Claude, MD Reginald Mcdonald is a 26 y.o. male presenting to clinic today for:  1. Ear pain/ sore throat Patient reports acute onset of right-sided ear pain, sore throat and lymph node enlargement on Monday evening. He reports pain with swallowing. He reports subjective fevers and chills at home. Denies cough, chest congestion, shortness of breath. Reports associated nasal congestion. He has a past surgical history significant for bilateral tympanostomies. Recently treated for sinus infection about 2 months ago. Past medical history significant for Barrett's esophagus.  No Known Allergies Past Medical History:  Diagnosis Date  . Barrett's esophagus 2012  . Esophageal reflux    Family History  Problem Relation Age of Onset  . Ulcerative colitis Father   . Colon polyps Mother   . Colon polyps Maternal Grandfather   . Colon cancer Neg Hx   . Rectal cancer Neg Hx   . Stomach cancer Neg Hx   . Esophageal cancer Neg Hx   . Pancreatic cancer Neg Hx   . Prostate cancer Neg Hx    Social Hx: non smoker.Current medications reviewed.    Health Maintenance: flu shot  ROS: Per HPI  Objective: Office vital signs reviewed. BP 122/76   Pulse 73   Temp 98.7 F (37.1 C) (Oral)   Ht  (1.727 m)   Wt 149 lb (67.6 kg)   BMI 22.66 kg/m   Physical Examination:  General: Awake, alert, well nourished, well appearing male, No acute distress HEENT: Normal    Neck: No masses palpated. Bilateral enlargement of anterior cervical lymph nodes.    Ears: Tympanic membranes intact, dull light reflex on right with erythema at the base of the tympanic membrane and purulence appreciated behind the tympanic membrane on the right, mild bulging; left tympanic membrane with scarring but no purulence.    Eyes: PERRLA, extraocular membranes intact, sclera white    Nose: nasal turbinates moist, clear nasal discharge    Throat: moist mucus membranes,  mild o/p erythema, tonsils mildly enlarged but no tonsillar exudate.  Airway is patent Cardio: regular rate and rhythm, S1S2 heard, no murmurs appreciated Pulm: clear to auscultation bilaterally, no wheezes, rhonchi or rales; normal work of breathing on room air  No results found for this or any previous visit (from the past 24 hour(s)).  Assessment/ Plan: 26 y.o. male   1. Acute suppurative otitis media of right ear without spontaneous rupture of tympanic membrane, recurrence not specified Clinically appears to be an acute otitis media. Patient has a past medical history significant for recurrent ear infections that required tympanostomies in the past. Currently he is afebrile with normal vital signs. His cardiopulmonary exam was unremarkable. We'll treat with 10 day course of Augmentin. Return precautions reviewed with patient. Follow up as needed. - amoxicillin-clavulanate (AUGMENTIN) 875-125 MG tablet; Take 1 tablet by mouth 2 (two) times daily. Take all of this medication  Dispense: 20 tablet; Refill: 0  2. Sore throat Right-sided sore throat. Likely secondary to pressure radiating down the eustachian tube from right-sided ear infection. His rapid strep was negative today. No evidence of tonsillar abscess on exam today. Because of his history of Barrett's esophagus, NSAIDs were not recommended today. Instead, he was prescribed viscous lidocaine to gargle and spit as needed for sore throat. Continue Tylenol as needed for fevers. Return precautions were reviewed. - Rapid strep screen (not at [facility]) - lidocaine (XYLOCAINE) 2 % solution; Gargle and spit 10 mL every 3  hours as needed for sore throat.  Dispense: 100 mL; Refill: 0   Orders Placed This Encounter  Procedures  . Rapid strep screen (not at [facility])   Meds ordered this encounter  Medications  . amoxicillin-clavulanate (AUGMENTIN) 875-125 MG tablet    Sig: Take 1 tablet by mouth 2 (two) times daily. Take all of this medication     Dispense:  20 tablet    Refill:  0  . lidocaine (XYLOCAINE) 2 % solution    Sig: Gargle and spit 10 mL every 3 hours as needed for sore throat.    Dispense:  100 mL    Refill:  0     Ashly Hulen Skains, DO [facility] [phone]

## 2017-08-10 ENCOUNTER — Ambulatory Visit: Payer: BLUE CROSS/BLUE SHIELD | Admitting: Family Medicine

## 2017-08-12 ENCOUNTER — Ambulatory Visit: Payer: BLUE CROSS/BLUE SHIELD | Admitting: Family Medicine

## 2017-08-22 ENCOUNTER — Ambulatory Visit (INDEPENDENT_AMBULATORY_CARE_PROVIDER_SITE_OTHER): Payer: BLUE CROSS/BLUE SHIELD | Admitting: Nurse Practitioner

## 2017-08-22 ENCOUNTER — Encounter: Payer: Self-pay | Admitting: Nurse Practitioner

## 2017-08-22 VITALS — BP 132/83 | HR 97 | Temp 98.4°F | Ht 68.0 in | Wt 148.0 lb

## 2017-08-22 DIAGNOSIS — Z20828 Contact with and (suspected) exposure to other viral communicable diseases: Secondary | ICD-10-CM

## 2017-08-22 LAB — VERITOR FLU A/B WAIVED
INFLUENZA A: NEGATIVE
INFLUENZA B: NEGATIVE

## 2017-08-22 MED ORDER — OSELTAMIVIR PHOSPHATE 75 MG PO CAPS
75.0000 mg | ORAL_CAPSULE | Freq: Every day | ORAL | 0 refills | Status: DC
Start: 2017-08-22 — End: 2017-09-10

## 2017-08-22 NOTE — Patient Instructions (Signed)

## 2017-08-22 NOTE — Progress Notes (Signed)
   Subjective:    Patient ID: Reginald Mcdonald, male    DOB: 08-12-1990, 27 y.o.   MRN: [id number]  HPI Patient comes in c/o exposure to flu last week by 2 employees. He started with body aches the is morning and he does not want flu.    Review of Systems  Constitutional: Positive for chills. Negative for fever.  HENT: Positive for congestion. Negative for sore throat, trouble swallowing and voice change.   Respiratory: Positive for cough (productive cough).   Gastrointestinal: Negative.   Musculoskeletal: Positive for myalgias.  Neurological: Positive for headaches.  Psychiatric/Behavioral: Negative.   All other systems reviewed and are negative.      Objective:   Physical Exam  Constitutional: He is oriented to person, place, and time. He appears well-developed and well-nourished. He appears distressed (mild).  HENT:  Right Ear: Hearing, tympanic membrane, external ear and ear canal normal.  Left Ear: Hearing, tympanic membrane, external ear and ear canal normal.  Nose: Mucosal edema and rhinorrhea present. Right sinus exhibits no maxillary sinus tenderness and no frontal sinus tenderness. Left sinus exhibits no maxillary sinus tenderness and no frontal sinus tenderness.  Mouth/Throat: Uvula is midline and oropharynx is clear and moist.  Neck: Normal range of motion. Neck supple.  Cardiovascular: Normal rate and regular rhythm.  Pulmonary/Chest: Effort normal and breath sounds normal. No respiratory distress. He has no wheezes. He has no rales. He exhibits no tenderness.  Dry cough  Lymphadenopathy:    He has no cervical adenopathy.  Neurological: He is alert and oriented to person, place, and time.  Skin: Skin is warm.  Psychiatric: He has a normal mood and affect. His behavior is normal. Judgment and thought content normal.      BP 132/83   Pulse 97   Temp 98.4 F (36.9 C) (Oral)   Ht 5\' 8"  (1.727 m)   Wt 148 lb (67.1 kg)   BMI 22.50 kg/m      Assessment & Plan:    1. Exposure to the flu    1. Take meds as prescribed 2. Use a cool mist humidifier especially during the winter months and when heat has been humid. 3. Use saline nose sprays frequently 4. Saline irrigations of the nose can be very helpful if done frequently.  * 4X daily for 1 week*  * Use of a nettie pot can be helpful with this. Follow directions with this* 5. Drink plenty of fluids 6. Keep thermostat turn down low 7.For any cough or congestion  Use plain Mucinex- regular strength or max strength is fine   * Children- consult with Pharmacist for dosing 8. For fever or aces or pains- take tylenol or ibuprofen appropriate for age and weight.  * for fevers greater than 101 orally you may alternate ibuprofen and tylenol every  3 hours.   Meds ordered this encounter  Medications  . oseltamivir (TAMIFLU) 75 MG capsule    Sig: Take 1 capsule (75 mg total) by mouth daily.    Dispense:  10 capsule    Refill:  0    Order Specific Question:   Supervising Provider    Answer:   Johna SheriffVINCENT, CAROL L [[id number]]   Mary-Margaret Daphine DeutscherMartin, FNP

## 2017-08-25 ENCOUNTER — Telehealth: Payer: Self-pay | Admitting: Family Medicine

## 2017-08-25 NOTE — Telephone Encounter (Signed)
Pt aware.

## 2017-08-25 NOTE — Telephone Encounter (Signed)
It is just viral- needs to treat symptoms with OTC meds or need sto bee seen if thinks needs anitbiotic

## 2017-08-25 NOTE — Telephone Encounter (Signed)
Have the patient start taking the Tamiflu twice daily.  If symptoms continue to get worse in spite of that, he should follow-up in the office.

## 2017-08-25 NOTE — Telephone Encounter (Signed)
He was here with URI, cough and congestion and sore throat are no better. He has been taking tamiflu, not touching these symptoms

## 2017-08-26 ENCOUNTER — Ambulatory Visit (INDEPENDENT_AMBULATORY_CARE_PROVIDER_SITE_OTHER): Payer: BLUE CROSS/BLUE SHIELD | Admitting: Family Medicine

## 2017-08-26 ENCOUNTER — Encounter: Payer: Self-pay | Admitting: Family Medicine

## 2017-08-26 VITALS — BP 124/76 | HR 62 | Temp 99.6°F | Ht 68.0 in | Wt 147.0 lb

## 2017-08-26 DIAGNOSIS — J0100 Acute maxillary sinusitis, unspecified: Secondary | ICD-10-CM | POA: Diagnosis not present

## 2017-08-26 MED ORDER — AZITHROMYCIN 250 MG PO TABS: ORAL_TABLET | ORAL | 0 refills | Status: DC

## 2017-08-26 NOTE — Progress Notes (Signed)
BP 124/76   Pulse 62   Temp 99.6 F (37.6 C) (Oral)   Ht 5\' 8"  (1.727 m)   Wt 147 lb (66.7 kg)   BMI 22.35 kg/m    Subjective:    Patient ID: Reginald Mcdonald, male    DOB: December 07, 1990, 27 y.o.   MRN: [id number]  HPI: Reginald Mcdonald is a 27 y.o. male presenting on 08/26/2017 for Left ear pain, draining, pressure,, fever (seen 2/18 because exposed to flu, still on Tamiflu)   HPI Sinus congestion and fever and drainage Patient comes in because of sinus congestion and fever and drainage that has been going on for the past 4 days.  He says that there is been a lot of influenza going around his office and he was started on Tamiflu on 08/22/2017 and has been taking it but he feels like he is gotten worse and had fevers over the past couple days up to 100.2 that he is taken the temperature for.  He denies any shortness of breath or wheezing but does feel like he has left ear pain and drainage down the left side of his throat and a sore throat on that side.  He has some cough that is mostly nonproductive.  Relevant past medical, surgical, family and social history reviewed and updated as indicated. Interim medical history since our last visit reviewed. Allergies and medications reviewed and updated.  Review of Systems  Constitutional: Positive for chills and fever.  HENT: Positive for congestion, postnasal drip, rhinorrhea, sinus pressure and sore throat. Negative for ear discharge, ear pain, sneezing and voice change.   Eyes: Negative for pain, discharge, redness and visual disturbance.  Respiratory: Positive for cough. Negative for shortness of breath and wheezing.   Cardiovascular: Negative for chest pain and leg swelling.  Musculoskeletal: Positive for myalgias. Negative for gait problem.  Skin: Negative for rash.  All other systems reviewed and are negative.   Per HPI unless specifically indicated above   Allergies as of 08/26/2017   No Known Allergies     Medication List        Accurate as of 08/26/17  2:16 PM. Always use your most recent med list.          azithromycin 250 MG tablet Commonly known as:  ZITHROMAX Take 2 the first day and then one each day after.   finasteride 1 MG tablet Commonly known as:  PROPECIA Take 1 tablet (1 mg total) by mouth daily.   omeprazole 40 MG capsule Commonly known as:  PRILOSEC Take 1 capsule (40 mg total) by mouth 2 (two) times daily.   oseltamivir 75 MG capsule Commonly known as:  TAMIFLU Take 1 capsule (75 mg total) by mouth daily.          Objective:    BP 124/76   Pulse 62   Temp 99.6 F (37.6 C) (Oral)   Ht 5\' 8"  (1.727 m)   Wt 147 lb (66.7 kg)   BMI 22.35 kg/m   Wt Readings from Last 3 Encounters:  08/26/17 147 lb (66.7 kg)  08/22/17 148 lb (67.1 kg)  04/13/17 149 lb (67.6 kg)    Physical Exam  Constitutional: He is oriented to person, place, and time. He appears well-developed and well-nourished. No distress.  HENT:  Right Ear: Tympanic membrane, external ear and ear canal normal.  Left Ear: Tympanic membrane, external ear and ear canal normal.  Nose: Mucosal edema and rhinorrhea present. No sinus tenderness. No epistaxis. Right  sinus exhibits maxillary sinus tenderness. Right sinus exhibits no frontal sinus tenderness. Left sinus exhibits maxillary sinus tenderness. Left sinus exhibits no frontal sinus tenderness.  Mouth/Throat: Uvula is midline and mucous membranes are normal. Posterior oropharyngeal edema and posterior oropharyngeal erythema present. No oropharyngeal exudate or tonsillar abscesses.  Eyes: Conjunctivae are normal. No scleral icterus.  Neck: Neck supple. No thyromegaly present.  Cardiovascular: Normal rate, regular rhythm, normal heart sounds and intact distal pulses.  No murmur heard. Pulmonary/Chest: Effort normal and breath sounds normal. No respiratory distress. He has no wheezes. He has no rales.  Musculoskeletal: Normal range of motion. He exhibits no edema.    Lymphadenopathy:    He has no cervical adenopathy.  Neurological: He is alert and oriented to person, place, and time. Coordination normal.  Skin: Skin is warm and dry. No rash noted. He is not diaphoretic.  Psychiatric: He has a normal mood and affect. His behavior is normal.  Nursing note and vitals reviewed.       Assessment & Plan:   Problem List Items Addressed This Visit    None    Visit Diagnoses    Acute non-recurrent maxillary sinusitis    -  Primary   Relevant Medications   azithromycin (ZITHROMAX) 250 MG tablet       Follow up plan: Return if symptoms worsen or fail to improve.  Counseling provided for all of the vaccine components No orders of the defined types were placed in this encounter.   Arville CareJoshua Shalaya Swailes, MD [facility] 08/26/2017, 2:16 PM

## 2017-08-29 ENCOUNTER — Telehealth: Payer: Self-pay | Admitting: Family Medicine

## 2017-08-29 MED ORDER — AMOXICILLIN-POT CLAVULANATE 875-125 MG PO TABS: 1.0000 | ORAL_TABLET | Freq: Two times a day (BID) | ORAL | 0 refills | Status: DC

## 2017-08-29 NOTE — Telephone Encounter (Signed)
Patient aware.

## 2017-08-29 NOTE — Telephone Encounter (Signed)
[facility] Arville CareJoshua Dettinger, MD [facility] 08/29/2017, 8:57 AM

## 2017-09-10 ENCOUNTER — Ambulatory Visit (INDEPENDENT_AMBULATORY_CARE_PROVIDER_SITE_OTHER): Payer: BLUE CROSS/BLUE SHIELD | Admitting: Nurse Practitioner

## 2017-09-10 VITALS — BP 124/79 | HR 70 | Temp 97.3°F | Ht 68.0 in | Wt 148.0 lb

## 2017-09-10 DIAGNOSIS — J029 Acute pharyngitis, unspecified: Secondary | ICD-10-CM | POA: Diagnosis not present

## 2017-09-10 DIAGNOSIS — H9201 Otalgia, right ear: Secondary | ICD-10-CM

## 2017-09-10 NOTE — Patient Instructions (Signed)

## 2017-09-10 NOTE — Progress Notes (Signed)
   Subjective:    Patient ID: Reginald Mcdonald, male    DOB: March 14, 1991, 27 y.o.   MRN: [id number]  HPI  Patinet come sin today c/o right ear pain and throat pain that started Thursday. Reginald Mcdonald just finished augmentin last week for left ear and throat pain. That side is better.   Review of Systems  Constitutional: Negative.   HENT: Positive for congestion, ear pain and rhinorrhea.   Respiratory: Positive for cough.   Genitourinary: Negative.   Neurological: Negative.   Psychiatric/Behavioral: Negative.   All other systems reviewed and are negative.      Objective:   Physical Exam  Constitutional: Reginald Mcdonald is oriented to person, place, and time. Reginald Mcdonald appears well-developed and well-nourished. No distress.  HENT:  Right Ear: Hearing, tympanic membrane, external ear and ear canal normal.  Left Ear: Hearing, tympanic membrane, external ear and ear canal normal.  Nose: Mucosal edema and rhinorrhea present. Right sinus exhibits no maxillary sinus tenderness and no frontal sinus tenderness. Left sinus exhibits no maxillary sinus tenderness and no frontal sinus tenderness.  Cardiovascular: Normal rate and regular rhythm.  Pulmonary/Chest: Effort normal and breath sounds normal.  Neurological: Reginald Mcdonald is alert and oriented to person, place, and time.  Skin: Skin is warm.  Psychiatric: Reginald Mcdonald has a normal mood and affect. His behavior is normal. Judgment and thought content normal.   BP 124/79   Pulse 70   Temp (!) 97.3 F (36.3 C) (Oral)   Ht 5\' 8"  (1.727 m)   Wt 148 lb (67.1 kg)   BMI 22.50 kg/m       Assessment & Plan:   1. Otalgia of right ear   2. Pharyngitis, unspecified etiology    Suspect these are both viral Force fluids Motrin or tylenol for fever and pain flonase nasal spray OTC   Mary-Margaret Daphine DeutscherMartin, FNP

## 2017-10-13 DIAGNOSIS — H73891 Other specified disorders of tympanic membrane, right ear: Secondary | ICD-10-CM | POA: Diagnosis not present

## 2017-10-20 ENCOUNTER — Encounter: Payer: Self-pay | Admitting: Family Medicine

## 2017-10-20 ENCOUNTER — Ambulatory Visit (INDEPENDENT_AMBULATORY_CARE_PROVIDER_SITE_OTHER): Payer: BLUE CROSS/BLUE SHIELD | Admitting: Family Medicine

## 2017-10-20 VITALS — BP 125/76 | HR 79 | Temp 98.7°F | Ht 68.0 in | Wt 148.4 lb

## 2017-10-20 DIAGNOSIS — R059 Cough, unspecified: Secondary | ICD-10-CM

## 2017-10-20 DIAGNOSIS — R05 Cough: Secondary | ICD-10-CM

## 2017-10-20 MED ORDER — AZITHROMYCIN 250 MG PO TABS: ORAL_TABLET | ORAL | 0 refills | Status: DC

## 2017-10-20 NOTE — Progress Notes (Signed)
   HPI  Patient presents today with cough.  Patient explains he has had cough, runny nose, sore throat, and headache for about 1 day.  He states his wife had similar illness a couple weeks ago and has slowly improved.  He does have allergies and has done well with Flonase previously.  PMH: Smoking status noted ROS: Per HPI  Objective: BP 125/76   Pulse 79   Temp 98.7 F (37.1 C) (Oral)   Ht 5\' 8"  (1.727 m)   Wt 148 lb 6.4 oz (67.3 kg)   BMI 22.56 kg/m  Gen: NAD, alert, cooperative with exam HEENT: NCAT, oropharynx moist and clear, TMs normal bilaterally, no tenderness to palpation sinuses throughout, nares with swollen turbinates CV: RRR, good S1/S2, no murmur Resp: CTABL, no wheezes, non-labored Abd: SNTND, BS present, no guarding or organomegaly Ext: No edema, warm Neuro: Alert and oriented, No gross deficits  Assessment and plan:  #Cough Likely seasonal allergies, however he has had recent sick contact and it is a holiday weekend. Recommended starting Flonase and plain Claritin or Zyrtec for antihistamine Discussed supportive care with Mucinex if needed If patient worsening, develops fever, shortness of breath, or cough that I recommended to go ahead and start his azithromycin.  Meds ordered this encounter  Medications  . azithromycin (ZITHROMAX) 250 MG tablet    Sig: Take 2 tablets on day 1 and 1 tablet daily after that    Dispense:  6 tablet    Refill:  0    Murtis SinkSam Dennie Moltz, MD [facility] 10/20/2017, 5:29 PM

## 2017-10-20 NOTE — Patient Instructions (Signed)
Great to see you!  Start flonase 2 prays per nostril once daily, and start a plain zyrtec or claritin once daily.   Use mucinex if needed  Try the Z pack if you develop fevers, shortness of breath, or saevere cough.

## 2017-12-19 ENCOUNTER — Ambulatory Visit (INDEPENDENT_AMBULATORY_CARE_PROVIDER_SITE_OTHER): Payer: BLUE CROSS/BLUE SHIELD | Admitting: Family

## 2017-12-19 ENCOUNTER — Ambulatory Visit (INDEPENDENT_AMBULATORY_CARE_PROVIDER_SITE_OTHER): Payer: BLUE CROSS/BLUE SHIELD

## 2017-12-19 VITALS — BP 122/77 | HR 64 | Ht 68.0 in | Wt 150.0 lb

## 2017-12-19 DIAGNOSIS — S99921A Unspecified injury of right foot, initial encounter: Secondary | ICD-10-CM | POA: Diagnosis not present

## 2017-12-19 DIAGNOSIS — S90931A Unspecified superficial injury of right great toe, initial encounter: Secondary | ICD-10-CM

## 2017-12-19 DIAGNOSIS — Z23 Encounter for immunization: Secondary | ICD-10-CM | POA: Diagnosis not present

## 2017-12-19 DIAGNOSIS — S99929A Unspecified injury of unspecified foot, initial encounter: Secondary | ICD-10-CM

## 2017-12-19 MED ORDER — NAPROXEN 500 MG PO TABS: 500.0000 mg | ORAL_TABLET | Freq: Two times a day (BID) | ORAL | 1 refills | Status: DC

## 2017-12-19 NOTE — Patient Instructions (Addendum)
Nail Bed Injury The nail bed is the soft tissue under a fingernail or toenail that is the origin of new nail growth. Various types of injuries can occur at the nail bed. These injuries may involve bruising or bleeding under the nail, cuts (lacerations) in the nail or nail bed, or loss of a part of the nail or the whole nail (avulsion). In some cases, a nail bed injury accompanies another injury, such as a break (fracture) of the bone at the tip of the finger or toe. The nail bed includes the growth center of the nail. If this growth center is damaged, the injured nail may not grow back normally, or it may not grow at all. The regrown nail might have an abnormal shape or appearance. It can take several months for a damaged or torn-off nail to regrow. Depending on the nature and extent of the nail bed injury, there may be a permanent disruption of normal nail growth. What are the causes? This condition is usually caused by crushing, pinching, cutting, or tearing injuries of the fingertip or toe. These injuries may occur when a finger or toe gets caught in a door, hit by a hammer, or damaged in accidents involving electrical tools or power machinery. What are the signs or symptoms? Symptoms may vary depending on the type of injury. Symptoms may include:  Pain in the injured area.  Bleeding.  Swelling.  Discoloration.  Collection of blood under the nail (hematoma).  Deformed or split nail.  A loose nail that is not stuck to the nail bed.  Loss of all or part of the nail.  How is this diagnosed? This condition is diagnosed based on:  Your medical history. You will be asked how the injury occurred.  A physical exam. Your health care provider will see if your nail is loose or if there is a laceration of the nail bed.  X-rays may be done to see if you have a fracture.  Your health care provider might also check for conditions that may affect healing, such as diabetes, nerve problems, or poor  circulation. How is this treated? Treatment for this condition may depend on the type of injury. Sometimes, the injury may not require any treatment other than keeping the area clean and free of infection. Treatment may include:  Draining the collection of blood from under the nail. This can be done by making a small hole in the nail.  Removing all or part of your nail. This might be necessary in order to stitch (suture) any laceration in the nail bed.  Depending on the location and size of the nail bed injury, a torn off (avulsed) nail is sometimes stitched back in place to provide temporary protection to the nail bed until the new nail grows in.  Applying bandages (dressings) or splints to the area.  Antibiotic medicine to help prevent infection.  Pain medicine.  Receiving a tetanus shot. You may need a tetanus shot if: ? You cannot remember when you had your last tetanus shot. ? You have never had a tetanus shot. ? The injury broke your skin and you have not had a tetanus booster during the past 10 years.  For certain injuries, your health care provider may direct you to see a hand or foot specialist. Follow these instructions at home: Managing pain, stiffness, and swelling  Raise (elevate) the injured area above the level of your heart while you are sitting or lying down.  Keep your injury protected  with dressings or splints as told by your health care provider.  Keep any dressings clean and dry. Change or remove your dressings only as told by your health care provider.  For an injured toenail: ? Limit walking on your injured leg. ? Wear an open-toed shoe when you walk. ? Try to avoid letting your leg hang down (dangle) while you are sitting or lying down. General instructions  Take over-the-counter and prescription medicines only as told by your health care provider.  If you were prescribed an antibiotic medicine, use it as told by your health care provider. Do not stop  using the antibiotic even if you start to feel better.  Do not drive or use heavy machinery while taking prescription pain medicine.  Keep all follow-up visits as told by your health care provider. This is important. Contact a health care provider if:  You have pain that is not controlled with medicine.  You have more pain, drainage, or bleeding in the injured area.  You have redness, soreness, and swelling in the injured area.  You have a fever and your symptoms get worse. Get help right away if:  You have numbness or a blue discoloration of your finger or toe. Summary  The nail bed is the soft tissue under a fingernail or toenail that includes the growth center of the nail. If this growth center is damaged, the injured nail may not grow back normally, or it may not grow at all.  Raise (elevate) the injured area above the level of your heart while you are sitting or lying down.  Keep any dressings clean and dry. Change or remove your dressings only as told by your health care provider.  Take over-the-counter and prescription medicines only as told by your health care provider. This information is not intended to replace advice given to you by your health care provider. Make sure you discuss any questions you have with your health care provider. Document Released: 07/29/2004 Document Revised: 05/12/2016 Document Reviewed: 05/12/2016 Elsevier Interactive Patient Education  2017 [employer]  

## 2017-12-19 NOTE — Progress Notes (Signed)
   Subjective:    Patient ID: Reginald Mcdonald, male    DOB: 1990/08/06, 27 y.o.   MRN: [id number]  Chief Complaint  Patient presents with  . Laceration    great toe right. Dropped board on toe     Laceration   The incident occurred 1 to 3 hours ago. The laceration is located on the right foot (right great toe). The laceration mechanism was a blunt object (dropped a piece of wood). The pain is at a severity of 2/10. The pain is mild. The pain has been intermittent since onset. He reports no foreign bodies present. His tetanus status is out of date.      Review of Systems  All other systems reviewed and are negative.      Objective:   Physical Exam  Constitutional: He is oriented to person, place, and time. He appears well-developed and well-nourished. No distress.  HENT:  Head: Normocephalic.  Eyes: Pupils are equal, round, and reactive to light. Right eye exhibits no discharge. Left eye exhibits no discharge.  Neck: Normal range of motion. Neck supple. No thyromegaly present.  Cardiovascular: Normal rate, regular rhythm, normal heart sounds and intact distal pulses.  No murmur heard. Pulmonary/Chest: Effort normal and breath sounds normal. No respiratory distress. He has no wheezes.  Abdominal: Soft. Bowel sounds are normal. He exhibits no distension. There is no tenderness.  Musculoskeletal: Normal range of motion. He exhibits no edema or tenderness.  Neurological: He is alert and oriented to person, place, and time. He has normal reflexes. No cranial nerve deficit.  Skin: Skin is warm and dry. No rash noted. No erythema.  Right great toenail ecchymosis present and a small laceration on base of toenail approx 0.2 mm   Psychiatric: He has a normal mood and affect. His behavior is normal. Judgment and thought content normal.  Vitals reviewed.  Xray- Negative for fracture  BP 122/77   Pulse 64   Ht 5\' 8"  (1.727 m)   Wt 150 lb (68 kg)   BMI 22.81 kg/m       Assessment &  Plan:  [name] comes in today with chief complaint of Laceration (great toe right. Dropped board on toe )   Diagnosis and orders addressed:  1. Toe injury, right, initial encounter - DG Toe Great Right; Future - naproxen (NAPROSYN) 500 MG tablet; Take 1 tablet (500 mg total) by mouth 2 (two) times daily with a meal.  Dispense: 60 tablet; Refill: 1  2. Injury of nail bed of toe - naproxen (NAPROSYN) 500 MG tablet; Take 1 tablet (500 mg total) by mouth 2 (two) times daily with a meal.  Dispense: 60 tablet; Refill: 1   Keep elevated Start naproxen BID Report any s/s of infection- fever, erythemas, or discharge RTO if symptoms worsen or do not improve   Jannifer Rodneyhristy Domanick Cuccia, FNP

## 2017-12-19 NOTE — Addendum Note (Signed)
Addended by: Tamera PuntWRAY, WENDY S on: 12/19/2017 02:41 PM   Modules accepted: Orders

## 2017-12-21 ENCOUNTER — Ambulatory Visit (INDEPENDENT_AMBULATORY_CARE_PROVIDER_SITE_OTHER): Payer: BLUE CROSS/BLUE SHIELD | Admitting: Family Medicine

## 2017-12-21 ENCOUNTER — Encounter: Payer: Self-pay | Admitting: Family Medicine

## 2017-12-21 ENCOUNTER — Ambulatory Visit: Payer: BLUE CROSS/BLUE SHIELD

## 2017-12-21 VITALS — BP 120/79 | HR 68 | Temp 97.4°F | Ht 68.0 in | Wt 151.0 lb

## 2017-12-21 DIAGNOSIS — S91211A Laceration without foreign body of right great toe with damage to nail, initial encounter: Secondary | ICD-10-CM | POA: Diagnosis not present

## 2017-12-21 DIAGNOSIS — S90211A Contusion of right great toe with damage to nail, initial encounter: Secondary | ICD-10-CM

## 2017-12-21 NOTE — Progress Notes (Signed)
Subjective: CC: toe laceration PCP: Mechele Claude, MD Reginald Mcdonald is a 27 y.o. male presenting to clinic today for:  1. Toe laceration Patient was seen on 12/19/2017 for laceration to the right great toe with small amounts of nailbed involvement.  He was given Naprosyn and instructions to keep leg elevated.  He follows up today stating that the base of his nail continues to ooze blood.  He notes that he is changing his bandage 3 times per day.  He has resumed work and is on his feet during the day.  He notes that when he was off of his feet, the lesion seemed to do better.  Denies any fevers, chills, purulence from the lesion.  Pain is well controlled with Naprosyn, which she has been using.   ROS: Per HPI  No Known Allergies Past Medical History:  Diagnosis Date  . Barrett's esophagus 2012  . Esophageal reflux     Current Outpatient Medications:  .  finasteride (PROPECIA) 1 MG tablet, Take 1 tablet (1 mg total) by mouth daily., Disp: 30 tablet, Rfl: 11 .  naproxen (NAPROSYN) 500 MG tablet, Take 1 tablet (500 mg total) by mouth 2 (two) times daily with a meal., Disp: 60 tablet, Rfl: 1 .  omeprazole (PRILOSEC) 40 MG capsule, Take 1 capsule (40 mg total) by mouth 2 (two) times daily., Disp: 60 capsule, Rfl: 11 Social History   Socioeconomic History  . Marital status: Single    Spouse name: Not on file  . Number of children: Not on file  . Years of education: Not on file  . Highest education level: Not on file  Occupational History  . Occupation: Consulting civil engineer  Social Needs  . Financial resource strain: Not on file  . Food insecurity:    Worry: Not on file    Inability: Not on file  . Transportation needs:    Medical: Not on file    Non-medical: Not on file  Tobacco Use  . Smoking status: Never Smoker  . Smokeless tobacco: Never Used  Substance and Sexual Activity  . Alcohol use: Yes    Alcohol/week: 0.0 oz    Comment: OCC  . Drug use: No  . Sexual activity: Not on  file  Lifestyle  . Physical activity:    Days per week: Not on file    Minutes per session: Not on file  . Stress: Not on file  Relationships  . Social connections:    Talks on phone: Not on file    Gets together: Not on file    Attends religious service: Not on file    Active member of club or organization: Not on file    Attends meetings of clubs or organizations: Not on file    Relationship status: Not on file  . Intimate partner violence:    Fear of current or ex partner: Not on file    Emotionally abused: Not on file    Physically abused: Not on file    Forced sexual activity: Not on file  Other Topics Concern  . Not on file  Social History Narrative  . Not on file   Family History  Problem Relation Age of Onset  . Ulcerative colitis Father   . Colon polyps Mother   . Colon polyps Maternal Grandfather   . Colon cancer Neg Hx   . Rectal cancer Neg Hx   . Stomach cancer Neg Hx   . Esophageal cancer Neg Hx   . Pancreatic cancer  Neg Hx   . Prostate cancer Neg Hx     Objective: Office vital signs reviewed. BP 120/79   Pulse 68   Temp (!) 97.4 F (36.3 C) (Oral)   Ht 5\' 8"  (1.727 m)   Wt 151 lb (68.5 kg)   BMI 22.96 kg/m   Physical Examination:  General: Awake, alert, well nourished, No acute distress MSK:  Right great toe: Right great toe with subungual hematoma appreciated.  It is actively oozing from the apex of the nailbed.  He also has oozing at the base of the nailbed with associated ecchymosis.  No evidence of secondary infection.  No purulence.  Minimally tender to palpation.  No gross swelling.  Assessment/ Plan: 27 y.o. male   1. Laceration of right great toe without foreign body with damage to nail, initial encounter I suspect that patient continues to disrupt scab formation since he has resumed work and uses work boots.  The base of the nail was treated with silver nitrate stick here in office.  Hemostasis was achieved.  Home care instructions were  reviewed.  I recommended that he stay off of foot for a couple of days if possible so that he can develop a good scab formation.  Patient aware of signs and symptoms of infection, which was not appreciated during today's visit.  He will follow-up as needed.  2. Subungual hematoma of great toe of right foot, initial encounter No significant pain.  I suspect that the active bleeding over the last couple of days is probably relieved any pressure associated with the hematoma.  Continue current regimen.  Elevate lower extremity.  Follow-up if needed.   Reginald IpAshly M Brigid Vandekamp, DO [facility] [phone]

## 2017-12-29 ENCOUNTER — Encounter: Payer: Self-pay | Admitting: Pediatrics

## 2017-12-29 ENCOUNTER — Ambulatory Visit: Payer: BLUE CROSS/BLUE SHIELD

## 2017-12-29 ENCOUNTER — Ambulatory Visit (INDEPENDENT_AMBULATORY_CARE_PROVIDER_SITE_OTHER): Payer: BLUE CROSS/BLUE SHIELD | Admitting: Pediatrics

## 2017-12-29 VITALS — BP 133/82 | HR 72 | Temp 97.3°F | Ht 68.0 in | Wt 147.0 lb

## 2017-12-29 DIAGNOSIS — L03031 Cellulitis of right toe: Secondary | ICD-10-CM

## 2017-12-29 MED ORDER — CEPHALEXIN 500 MG PO CAPS: 500.0000 mg | ORAL_CAPSULE | Freq: Three times a day (TID) | ORAL | 0 refills | Status: DC

## 2017-12-29 NOTE — Progress Notes (Signed)
  Subjective:   Patient ID: Reginald Mcdonald, male    DOB: 27-Dec-1990, 27 y.o.   MRN: [id number] CC: toe redness (toe injury, now redness around injury)  HPI: Reginald Mcdonald is a 27 y.o. male   Seen for laceration to right great toe 6/17.  Followed up 6/19 for ongoing oozing.  Silver nitrate stick used to achieve hemostasis.  Toe is been feeling better.  He keeps it covered.  No longer as sore and tender at laceration site.  Noticed red bump lateral to base of nail bed yesterday.  Feels pressure when it is pushed on.  No discharge on toe.  Relevant past medical, surgical, family and social history reviewed. Allergies and medications reviewed and updated. Social History   Tobacco Use  Smoking Status Never Smoker  Smokeless Tobacco Never Used   ROS: Per HPI   Objective:    BP 133/82 (BP Location: Left Arm, Patient Position: Sitting, Cuff Size: Normal)   Pulse 72   Temp (!) 97.3 F (36.3 C) (Oral)   Ht 5\' 8"  (1.727 m)   Wt 147 lb (66.7 kg)   BMI 22.35 kg/m   Wt Readings from Last 3 Encounters:  12/29/17 147 lb (66.7 kg)  12/21/17 151 lb (68.5 kg)  12/19/17 150 lb (68 kg)    Gen: NAD, alert, cooperative with exam, NCAT EYES: EOMI, no conjunctival injection, or no icterus ENT:   OP without erythema LYMPH: no cervical LAD Abd: +BS, soft, NTND. no guarding or organomegaly Ext: No edema, warm Neuro: Alert and oriented, strength equal b/l UE and LE, coordination grossly normal Skin: Right great toe nailbed starting medially running across with changes consistent with silver nitrate stick cauterization.  Lateral nail bed with 2 mm soft red papule, mildly tender to palpation.  Nailbed itself without tenderness with palpation.  No induration or fluctuance along sides of nail bilaterally.  Much of lateral nail discolored by blood underneath.  Assessment & Plan:  Reginald Mcdonald was seen today for toe redness.  Diagnoses and all orders for this visit:  Cellulitis of toe of right foot Start  below, return precautions discussed -     cephALEXin (KEFLEX) 500 MG capsule; Take 1 capsule (500 mg total) by mouth 3 (three) times daily.   Follow up plan: prn Rex Krasarol Ellen Goris, MD [facility]

## 2018-01-20 ENCOUNTER — Other Ambulatory Visit: Payer: Self-pay | Admitting: Family Medicine

## 2018-01-20 DIAGNOSIS — L649 Androgenic alopecia, unspecified: Secondary | ICD-10-CM

## 2018-01-21 ENCOUNTER — Other Ambulatory Visit: Payer: Self-pay | Admitting: Gastroenterology

## 2018-02-21 ENCOUNTER — Other Ambulatory Visit: Payer: Self-pay | Admitting: Gastroenterology

## 2018-04-18 ENCOUNTER — Ambulatory Visit: Payer: BLUE CROSS/BLUE SHIELD

## 2018-04-19 ENCOUNTER — Other Ambulatory Visit: Payer: Self-pay | Admitting: Gastroenterology

## 2018-04-19 ENCOUNTER — Ambulatory Visit (INDEPENDENT_AMBULATORY_CARE_PROVIDER_SITE_OTHER): Payer: BLUE CROSS/BLUE SHIELD

## 2018-04-19 DIAGNOSIS — Z23 Encounter for immunization: Secondary | ICD-10-CM | POA: Diagnosis not present

## 2018-07-17 ENCOUNTER — Other Ambulatory Visit: Payer: Self-pay | Admitting: Family Medicine

## 2018-07-17 DIAGNOSIS — L649 Androgenic alopecia, unspecified: Secondary | ICD-10-CM

## 2018-07-17 NOTE — Telephone Encounter (Signed)
Last seen 12/29/17

## 2018-08-10 ENCOUNTER — Ambulatory Visit (INDEPENDENT_AMBULATORY_CARE_PROVIDER_SITE_OTHER): Payer: BLUE CROSS/BLUE SHIELD | Admitting: Gastroenterology

## 2018-08-10 ENCOUNTER — Encounter: Payer: Self-pay | Admitting: Gastroenterology

## 2018-08-10 ENCOUNTER — Other Ambulatory Visit (INDEPENDENT_AMBULATORY_CARE_PROVIDER_SITE_OTHER): Payer: BLUE CROSS/BLUE SHIELD

## 2018-08-10 VITALS — BP 100/60 | HR 64 | Ht 69.0 in | Wt 148.0 lb

## 2018-08-10 DIAGNOSIS — K2270 Barrett's esophagus without dysplasia: Secondary | ICD-10-CM

## 2018-08-10 DIAGNOSIS — E538 Deficiency of other specified B group vitamins: Secondary | ICD-10-CM

## 2018-08-10 LAB — CBC WITH DIFFERENTIAL/PLATELET
Basophils Absolute: 0 10*3/uL (ref 0.0–0.1)
Basophils Relative: 1 % (ref 0.0–3.0)
Eosinophils Absolute: 0.2 10*3/uL (ref 0.0–0.7)
Eosinophils Relative: 3.2 % (ref 0.0–5.0)
HCT: 44.2 % (ref 39.0–52.0)
Hemoglobin: 15.3 g/dL (ref 13.0–17.0)
LYMPHS ABS: 2.2 10*3/uL (ref 0.7–4.0)
Lymphocytes Relative: 43.7 % (ref 12.0–46.0)
MCHC: 34.6 g/dL (ref 30.0–36.0)
MCV: 86.4 fl (ref 78.0–100.0)
Monocytes Absolute: 0.6 10*3/uL (ref 0.1–1.0)
Monocytes Relative: 10.8 % (ref 3.0–12.0)
NEUTROS ABS: 2.1 10*3/uL (ref 1.4–7.7)
NEUTROS PCT: 41.3 % — AB (ref 43.0–77.0)
Platelets: 207 10*3/uL (ref 150.0–400.0)
RBC: 5.12 Mil/uL (ref 4.22–5.81)
RDW: 13.1 % (ref 11.5–15.5)
WBC: 5.1 10*3/uL (ref 4.0–10.5)

## 2018-08-10 LAB — HEPATIC FUNCTION PANEL
ALT: 24 U/L (ref 0–53)
AST: 16 U/L (ref 0–37)
Albumin: 4.6 g/dL (ref 3.5–5.2)
Alkaline Phosphatase: 68 U/L (ref 39–117)
Bilirubin, Direct: 0.1 mg/dL (ref 0.0–0.3)
Total Bilirubin: 0.7 mg/dL (ref 0.2–1.2)
Total Protein: 7 g/dL (ref 6.0–8.3)

## 2018-08-10 LAB — BASIC METABOLIC PANEL
BUN: 11 mg/dL (ref 6–23)
CHLORIDE: 104 meq/L (ref 96–112)
CO2: 30 mEq/L (ref 19–32)
Calcium: 9.5 mg/dL (ref 8.4–10.5)
Creatinine, Ser: 0.75 mg/dL (ref 0.40–1.50)
GFR: 123.24 mL/min (ref >60.00)
Glucose, Bld: 86 mg/dL (ref 70–99)
Potassium: 4 mEq/L (ref 3.5–5.1)
Sodium: 140 mEq/L (ref 135–145)

## 2018-08-10 LAB — TSH: TSH: 1.02 u[IU]/mL (ref 0.35–4.50)

## 2018-08-10 LAB — VITAMIN B12: Vitamin B-12: 178 pg/mL — ABNORMAL LOW (ref 211–911)

## 2018-08-10 MED ORDER — OMEPRAZOLE 40 MG PO CPDR: 40.0000 mg | DELAYED_RELEASE_CAPSULE | Freq: Two times a day (BID) | ORAL | 11 refills | Status: DC

## 2018-08-10 NOTE — Progress Notes (Signed)
    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  His reflux symptoms are under very good control.  He has occasional breakthrough symptoms with certain foods and eating late at night and takes Tums every few weeks for these symptoms.  He notes a lack of energy and mild fatigue for the past several weeks.  B12 deficiency was diagnosed several years ago but he did not return to his PCP for treatment as we recommended.  Current Medications, Allergies, Past Medical History, Past Surgical History, Family History and Social History were reviewed in [employer] record.  Physical Exam: General: Well developed, well nourished, no acute distress Head: Normocephalic and atraumatic Eyes:  sclerae anicteric, EOMI Ears: Normal auditory acuity Mouth: No deformity or lesions Lungs: Clear throughout to auscultation Heart: Regular rate and rhythm; no murmurs, rubs or bruits Abdomen: Soft, non tender and non distended. No masses, hepatosplenomegaly or hernias noted. Normal Bowel sounds Rectal: Not done Musculoskeletal: Symmetrical with no gross deformities  Pulses:  Normal pulses noted Extremities: No clubbing, cyanosis, edema or deformities noted Neurological: Alert oriented x 4, grossly nonfocal Psychological:  Alert and cooperative. Normal mood and affect   Assessment and Recommendations:  1.  Barrett's esophagus, short segment, without dysplasia.  Reflux symptoms under good control.  Continue omeprazole 40 mg twice daily.  Tums as needed.  Follow antireflux measures.  A 3-year interval surveillance endoscopy is due in May 2020.  2.  B12 deficiency, fatigue. CMP, CBC, TSH, B12 today.  If he remains B12 deficient the importance of follow-up and treatment under the care of his PCP was stressed and he is agreeable to follow through.

## 2018-08-10 NOTE — Patient Instructions (Signed)
Your provider has requested that you go to the basement level for lab work before leaving today. Press "B" on the elevator. The lab is located at the first door on the left as you exit the elevator.  We have sent the following medications to your pharmacy for you to pick up at your convenience: omeprazole.   You will be due for a recall EGD in 11/2018. We will send you a reminder in the mail when it gets closer to that time.  Thank you for choosing me and [facility].  Venita Lick. Pleas Koch., MD., Clementeen Graham

## 2018-08-15 ENCOUNTER — Encounter: Payer: Self-pay | Admitting: Family Medicine

## 2018-08-15 ENCOUNTER — Ambulatory Visit (INDEPENDENT_AMBULATORY_CARE_PROVIDER_SITE_OTHER): Payer: BLUE CROSS/BLUE SHIELD | Admitting: Family Medicine

## 2018-08-15 VITALS — BP 127/67 | HR 68 | Temp 98.3°F | Ht 69.0 in | Wt 147.5 lb

## 2018-08-15 DIAGNOSIS — S90931A Unspecified superficial injury of right great toe, initial encounter: Secondary | ICD-10-CM

## 2018-08-15 DIAGNOSIS — S99929A Unspecified injury of unspecified foot, initial encounter: Secondary | ICD-10-CM

## 2018-08-15 NOTE — Progress Notes (Signed)
Subjective:  Patient ID: Rosalyn ChartersClint M Rozell, male    DOB: [birth year]  Age: 28 y.o. MRN: [id number]  CC: Toe Pain (pt here today c/o big toe on right foot red and very tender)   HPI Cayden Ellison CarwinM Olsen presents for pain in the big toe of the right foot.  Patient points to the lateral tip of the nail.  Had a trauma from wood falling on it several months ago.  He lost the whole nail and has been growing back.  There has been no pain until recently.  He is concerned that it is ingrown.  When he tried to do some push-up position it caused a great deal of pain in the toe due to the pressure.  History [place] has a past medical history of Barrett's esophagus (2012) and Esophageal reflux.   He has a past surgical history that includes Wrist surgery (Right); Finger fracture surgery (Left); and Upper gi endoscopy.   His family history includes Colon polyps in his maternal grandfather and mother; Ulcerative colitis in his father.He reports that he has never smoked. He has never used smokeless tobacco. He reports current alcohol use. He reports that he does not use drugs.  Current Outpatient Medications on File Prior to Visit  Medication Sig Dispense Refill  . finasteride (PROPECIA) 1 MG tablet TAKE 1 TABLET BY MOUTH EVERY DAY 30 tablet 0  . omeprazole (PRILOSEC) 40 MG capsule Take 1 capsule (40 mg total) by mouth 2 (two) times daily. 60 capsule 11   No current facility-administered medications on file prior to visit.     ROS Review of Systems As per HPI Objective:  BP 127/67   Pulse 68   Temp 98.3 F (36.8 C) (Oral)   Ht 5\' 9"  (1.753 m)   Wt 147 lb 8 oz (66.9 kg)   BMI 21.78 kg/m   BP Readings from Last 3 Encounters:  08/15/18 127/67  08/10/18 100/60  12/29/17 133/82    Wt Readings from Last 3 Encounters:  08/15/18 147 lb 8 oz (66.9 kg)  08/10/18 148 lb (67.1 kg)  12/29/17 147 lb (66.7 kg)     Physical Exam Vitals signs reviewed.  Constitutional:      Appearance: He is  well-developed.  HENT:     Head: Normocephalic and atraumatic.     Right Ear: External ear normal.     Left Ear: External ear normal.     Mouth/Throat:     Pharynx: No oropharyngeal exudate or posterior oropharyngeal erythema.  Eyes:     Pupils: Pupils are equal, round, and reactive to light.  Neck:     Musculoskeletal: Normal range of motion and neck supple.  Cardiovascular:     Rate and Rhythm: Normal rate and regular rhythm.     Heart sounds: No murmur.  Pulmonary:     Effort: No respiratory distress.     Breath sounds: Normal breath sounds.  Musculoskeletal:        General: Deformity (  There is fresh nail forming at the base of the right first toenail.  However there is distally some scarred and malformed nail tissue.  At the lateral distal edge this appears to be ingrown.) present.  Neurological:     Mental Status: He is alert and oriented to person, place, and time.      After sterile prep and drape using Betadine for prep and 2% lidocaine but no epi for digital block.  The lateral aspect of the toenail was  trimmed revealing a portion that was ingrown..  This was lifted away from the surface and removed with a rondure.  The remaining deformed nail was debrided and sterile dressing applied. Assessment & Plan:   Stefano was seen today for toe pain.  Diagnoses and all orders for this visit:  Injury of nail bed of toe   Allergies as of 08/15/2018   No Known Allergies     Medication List       Accurate as of August 15, 2018  7:06 PM. Always use your most recent med list.        finasteride 1 MG tablet Commonly known as:  PROPECIA TAKE 1 TABLET BY MOUTH EVERY DAY   omeprazole 40 MG capsule Commonly known as:  PRILOSEC Take 1 capsule (40 mg total) by mouth 2 (two) times daily.       Wound care reviewed.  Follow-up as needed.  Signs and symptoms of infection reviewed.  Follow-up should they occur.   Follow-up: No follow-ups on file.  Mechele ClaudeWarren Deosha Werden, M.D.

## 2018-08-20 ENCOUNTER — Other Ambulatory Visit: Payer: Self-pay | Admitting: Family Medicine

## 2018-08-20 DIAGNOSIS — L649 Androgenic alopecia, unspecified: Secondary | ICD-10-CM

## 2018-08-22 ENCOUNTER — Other Ambulatory Visit: Payer: Self-pay | Admitting: Family Medicine

## 2018-08-22 ENCOUNTER — Telehealth: Payer: Self-pay | Admitting: *Deleted

## 2018-08-22 MED ORDER — CYANOCOBALAMIN 1000 MCG/ML IJ SOLN: 1000.0000 ug | INTRAMUSCULAR | 99 refills | Status: DC

## 2018-08-22 NOTE — Telephone Encounter (Signed)
I put in the order.  WS

## 2018-08-22 NOTE — Telephone Encounter (Signed)
Aware.  Appointment scheduled for injection this Friday.

## 2018-08-22 NOTE — Telephone Encounter (Signed)
Patient recently had a B12 level done at Dr. Anselm Jungling office labwork in [place]. Level is 178. He would like to start getting B12 injections here Please advise

## 2018-08-25 ENCOUNTER — Ambulatory Visit: Payer: BLUE CROSS/BLUE SHIELD

## 2018-08-28 ENCOUNTER — Ambulatory Visit (INDEPENDENT_AMBULATORY_CARE_PROVIDER_SITE_OTHER): Payer: BLUE CROSS/BLUE SHIELD | Admitting: *Deleted

## 2018-08-28 DIAGNOSIS — E538 Deficiency of other specified B group vitamins: Secondary | ICD-10-CM | POA: Diagnosis not present

## 2018-08-28 MED ORDER — CYANOCOBALAMIN 1000 MCG/ML IJ SOLN
1000.0000 ug | INTRAMUSCULAR | Status: AC
  Administered 2018-08-28 – 2018-09-06 (×2): 1000 ug via INTRAMUSCULAR

## 2018-08-28 NOTE — Progress Notes (Signed)
Pt given cyanocobalamin inj Tolerated well 

## 2018-09-06 ENCOUNTER — Ambulatory Visit (INDEPENDENT_AMBULATORY_CARE_PROVIDER_SITE_OTHER): Payer: BLUE CROSS/BLUE SHIELD | Admitting: *Deleted

## 2018-09-06 DIAGNOSIS — E538 Deficiency of other specified B group vitamins: Secondary | ICD-10-CM | POA: Diagnosis not present

## 2018-09-06 NOTE — Progress Notes (Signed)
Pt given Cyanocobalamin inj Tolerated well 

## 2018-09-16 ENCOUNTER — Other Ambulatory Visit: Payer: Self-pay | Admitting: Family Medicine

## 2018-09-16 DIAGNOSIS — L649 Androgenic alopecia, unspecified: Secondary | ICD-10-CM

## 2018-09-19 DIAGNOSIS — L03031 Cellulitis of right toe: Secondary | ICD-10-CM | POA: Diagnosis not present

## 2018-10-12 ENCOUNTER — Other Ambulatory Visit: Payer: Self-pay | Admitting: Family Medicine

## 2018-10-12 DIAGNOSIS — L649 Androgenic alopecia, unspecified: Secondary | ICD-10-CM

## 2018-11-27 ENCOUNTER — Encounter: Payer: Self-pay | Admitting: Gastroenterology

## 2019-01-04 ENCOUNTER — Other Ambulatory Visit: Payer: Self-pay | Admitting: Family Medicine

## 2019-01-04 DIAGNOSIS — L649 Androgenic alopecia, unspecified: Secondary | ICD-10-CM

## 2019-03-04 IMAGING — DX DG TOE GREAT 2+V*R*
3 series · 3 of 3 positions shown · non-contrast
Comparison: None.

CLINICAL DATA: Dropped a board on toe.  Initial encounter.

EXAM:
RIGHT GREAT TOE

[toe ap (1 of 2)]
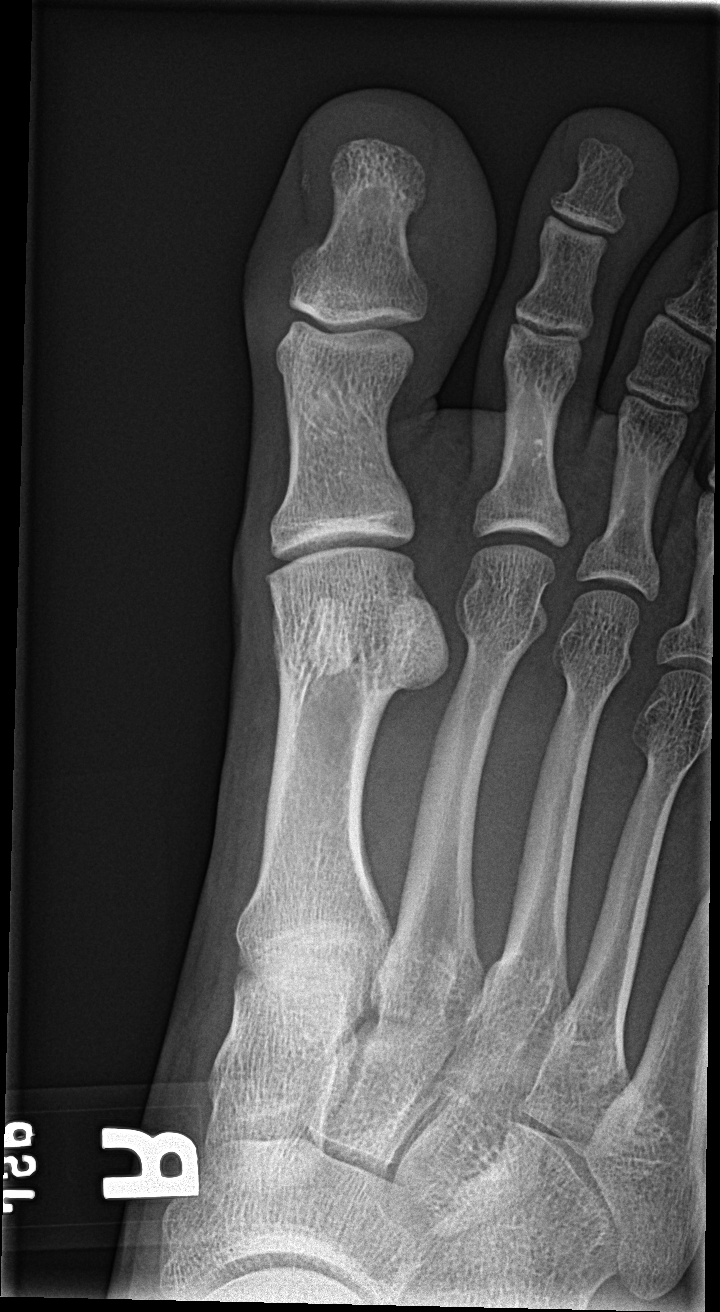

[toe lat]
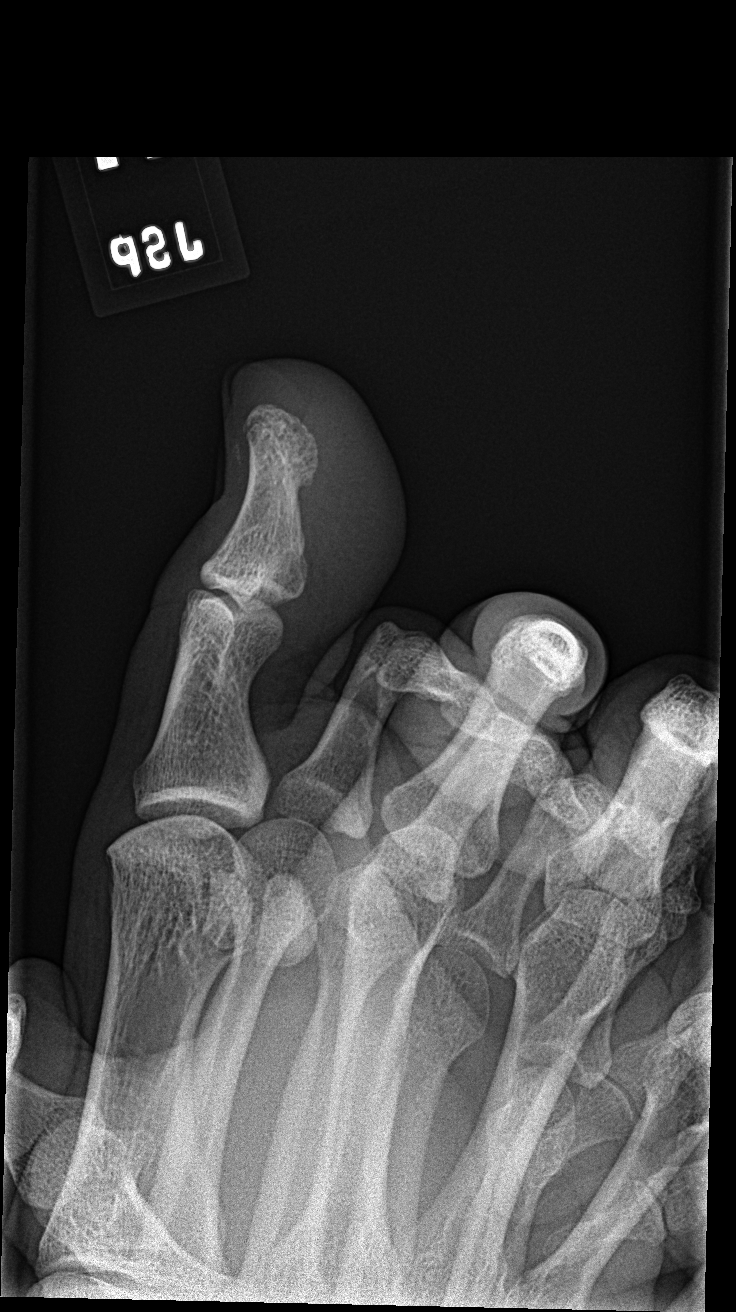

[toe ap (2 of 2)]
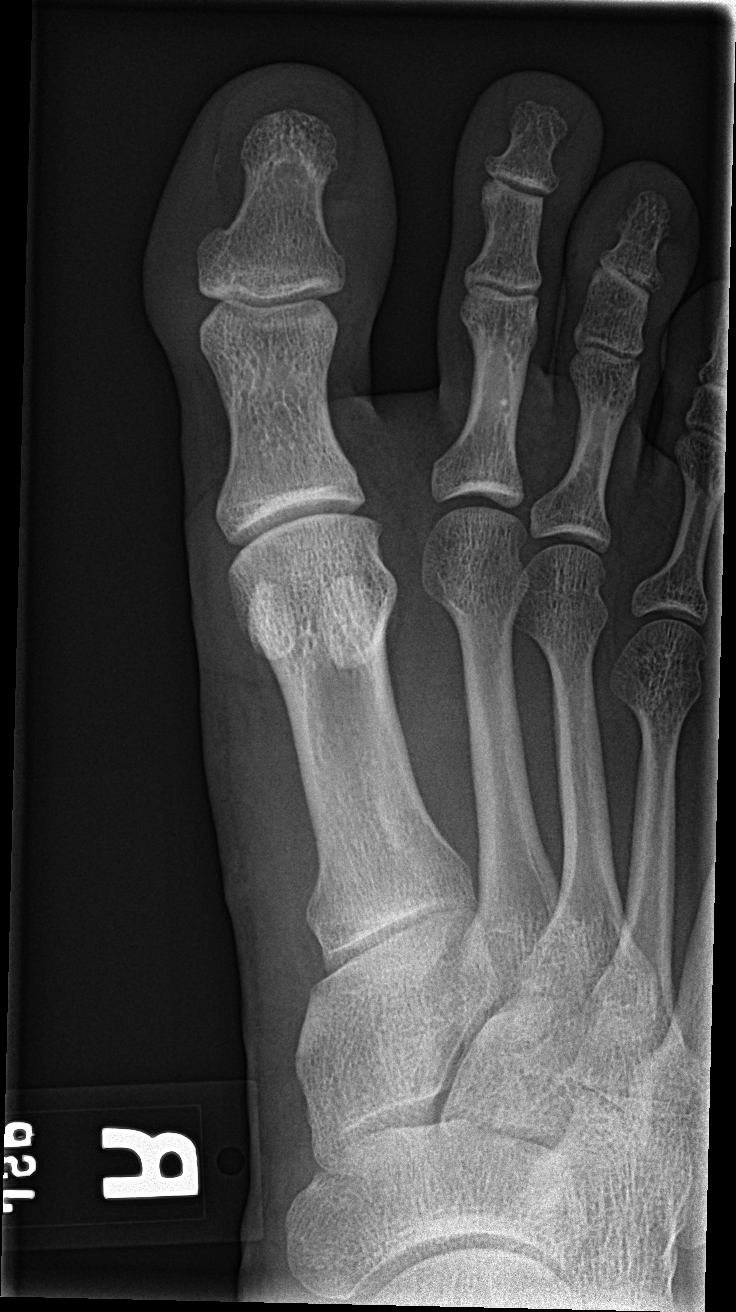

[3 of 3 positions shown; findings below may reference images not displayed]

FINDINGS: High-density material along the toenail is superficial. No imbedded
opaque foreign body. No convincing fracture- a lucency through the
tuft on the oblique view is incomplete and not seen on the other
views. No dislocation.
IMPRESSION: Negative.

## 2019-03-28 ENCOUNTER — Other Ambulatory Visit: Payer: Self-pay | Admitting: Family Medicine

## 2019-03-28 DIAGNOSIS — L649 Androgenic alopecia, unspecified: Secondary | ICD-10-CM

## 2019-05-29 ENCOUNTER — Encounter: Payer: Self-pay | Admitting: Nurse Practitioner

## 2019-05-29 ENCOUNTER — Ambulatory Visit (INDEPENDENT_AMBULATORY_CARE_PROVIDER_SITE_OTHER): Payer: BC Managed Care – PPO | Admitting: Nurse Practitioner

## 2019-05-29 DIAGNOSIS — R1032 Left lower quadrant pain: Secondary | ICD-10-CM | POA: Diagnosis not present

## 2019-05-29 NOTE — Progress Notes (Signed)
   Virtual Visit via telephone Note Due to COVID-19 pandemic this visit was conducted virtually. This visit type was conducted due to national recommendations for restrictions regarding the COVID-19 Pandemic (e.g. social distancing, sheltering in place) in an effort to limit this patient's exposure and mitigate transmission in our community. All issues noted in this document were discussed and addressed.  A physical exam was not performed with this format.  I connected with Reginald Mcdonald on 05/29/19 at 11:20 by telephone and verified that I am speaking with the correct person using two identifiers. Reginald Mcdonald is currently located at work and no one  is currently with him during visit. The provider, Mary-Margaret Hassell Done, FNP is located in their office at time of visit.  I discussed the limitations, risks, security and privacy concerns of performing an evaluation and management service by telephone and the availability of in person appointments. I also discussed with the patient that there may be a patient responsible charge related to this service. The patient expressed understanding and agreed to proceed.   History and Present Illness:  Has a dull burning sensation that travels down into his left testicle. Started Sunday morning.sensation is constant. Rates 2/10. Nothing makes it worse and nothing makes it better. No pain on palpation of tetsicle. Denies voiding problem, no discharge, no hematuria and no back pain. NO palpable masses   Review of Systems  Constitutional: Negative.   HENT: Negative.   Respiratory: Negative.   Cardiovascular: Negative.   Musculoskeletal: Negative.   Skin: Negative.   Neurological: Negative.   Psychiatric/Behavioral: Negative.   All other systems reviewed and are negative.    Observations/Objective: Alert and oriented- answers all questions appropriately No distress No testicular pain on palpation  Assessment and Plan: Reginald Mcdonald in today with  chief complaint of Testicle Pain   1. Groin pain, left Motrin every 6 hours Ice if needed. If no better or worsens in the next 2 days, will need a face to face visit.   Follow Up Instructions: prn    I discussed the assessment and treatment plan with the patient. The patient was provided an opportunity to ask questions and all were answered. The patient agreed with the plan and demonstrated an understanding of the instructions.   The patient was advised to call back or seek an in-person evaluation if the symptoms worsen or if the condition fails to improve as anticipated.  The above assessment and management plan was discussed with the patient. The patient verbalized understanding of and has agreed to the management plan. Patient is aware to call the clinic if symptoms persist or worsen. Patient is aware when to return to the clinic for a follow-up visit. Patient educated on when it is appropriate to go to the emergency department.   Time call ended:  11:31  I provided 11 minutes of non-face-to-face time during this encounter.    Mary-Margaret Hassell Done, FNP

## 2019-06-20 ENCOUNTER — Ambulatory Visit (INDEPENDENT_AMBULATORY_CARE_PROVIDER_SITE_OTHER): Payer: BC Managed Care – PPO | Admitting: Family Medicine

## 2019-06-20 ENCOUNTER — Other Ambulatory Visit: Payer: Self-pay | Admitting: Family Medicine

## 2019-06-20 ENCOUNTER — Other Ambulatory Visit: Payer: Self-pay

## 2019-06-20 ENCOUNTER — Encounter: Payer: Self-pay | Admitting: Family Medicine

## 2019-06-20 VITALS — BP 123/82 | HR 75 | Temp 97.3°F | Resp 20 | Ht 69.0 in | Wt 148.0 lb

## 2019-06-20 DIAGNOSIS — N451 Epididymitis: Secondary | ICD-10-CM | POA: Diagnosis not present

## 2019-06-20 DIAGNOSIS — L649 Androgenic alopecia, unspecified: Secondary | ICD-10-CM

## 2019-06-20 MED ORDER — CIPROFLOXACIN HCL 500 MG PO TABS: 500.0000 mg | ORAL_TABLET | Freq: Two times a day (BID) | ORAL | 0 refills | Status: DC

## 2019-06-20 MED ORDER — DICLOFENAC SODIUM 75 MG PO TBEC: 75.0000 mg | DELAYED_RELEASE_TABLET | Freq: Two times a day (BID) | ORAL | 2 refills | Status: DC

## 2019-06-20 NOTE — Progress Notes (Signed)
Chief Complaint  Patient presents with  . Left testicle pain    HPI  Patient presents today for recurrence of pain in the left pubic area. Concerned for hernia, but has felt no mass. Denies dysuria. Pain is 3-4/10.   PMH: Smoking status noted ROS: Per HPI  Objective: BP 123/82   Pulse 75   Temp (!) 97.3 F (36.3 C) (Temporal)   Resp 20   Ht 5\' 9"  (1.753 m)   Wt 148 lb (67.1 kg)   SpO2 99%   BMI 21.86 kg/m  Gen: NAD, alert, cooperative with exam HEENT: NCAT, EOMI, PERRL Abd: SNTND, BS present, no guarding or organomegaly GU: tender to palpation of posteroinferior pole of left testicle. No masses. Oriented with long pole verticle. No edema. The inguinal canal is free of hernia.  Neuro: Alert and oriented, No gross deficits  Assessment and plan:  1. Epididymitis     Meds ordered this encounter  Medications  . ciprofloxacin (CIPRO) 500 MG tablet    Sig: Take 1 tablet (500 mg total) by mouth 2 (two) times daily. For prostate. Take all of these.    Dispense:  20 tablet    Refill:  0  . diclofenac (VOLTAREN) 75 MG EC tablet    Sig: Take 1 tablet (75 mg total) by mouth 2 (two) times daily. For muscle and  Joint pain    Dispense:  30 tablet    Refill:  2    No orders of the defined types were placed in this encounter.   Follow up as needed.  Claretta Fraise, MD

## 2019-09-11 ENCOUNTER — Other Ambulatory Visit: Payer: Self-pay | Admitting: Family Medicine

## 2019-09-11 DIAGNOSIS — L649 Androgenic alopecia, unspecified: Secondary | ICD-10-CM

## 2019-10-18 DIAGNOSIS — Z23 Encounter for immunization: Secondary | ICD-10-CM | POA: Diagnosis not present

## 2019-11-09 DIAGNOSIS — Z23 Encounter for immunization: Secondary | ICD-10-CM | POA: Diagnosis not present

## 2019-12-11 ENCOUNTER — Other Ambulatory Visit: Payer: Self-pay | Admitting: Family Medicine

## 2019-12-11 DIAGNOSIS — L649 Androgenic alopecia, unspecified: Secondary | ICD-10-CM

## 2020-03-11 ENCOUNTER — Other Ambulatory Visit: Payer: Self-pay | Admitting: Family Medicine

## 2020-03-11 DIAGNOSIS — L649 Androgenic alopecia, unspecified: Secondary | ICD-10-CM

## 2020-07-01 ENCOUNTER — Other Ambulatory Visit: Payer: Self-pay | Admitting: Family Medicine

## 2020-07-01 DIAGNOSIS — L649 Androgenic alopecia, unspecified: Secondary | ICD-10-CM

## 2020-07-02 ENCOUNTER — Other Ambulatory Visit: Payer: Self-pay | Admitting: Family Medicine

## 2020-07-02 ENCOUNTER — Telehealth: Payer: Self-pay

## 2020-07-02 DIAGNOSIS — L649 Androgenic alopecia, unspecified: Secondary | ICD-10-CM

## 2020-07-02 MED ORDER — FINASTERIDE 1 MG PO TABS: 1.0000 mg | ORAL_TABLET | Freq: Every day | ORAL | 3 refills | Status: DC

## 2020-07-02 NOTE — Telephone Encounter (Signed)
  Prescription Request  07/02/2020  What is the name of the medication or equipment? finasteride (PROPECIA) 1 MG tablet    Have you contacted your pharmacy to request a refill? (if applicable) ye  Which pharmacy would you like this sent to? [facility], pt has appt with Dr. Darlyn Read on 07/21/2020 and he is about to run out needs enough until appt    Patient notified that their request is being sent to the clinical staff for review and that they should receive a response within 2 business days.

## 2020-07-02 NOTE — Telephone Encounter (Signed)
Please let the patient know that I sent their prescription to their pharmacy. Thanks, WS 

## 2020-07-02 NOTE — Telephone Encounter (Signed)
Pt aware rx sent in. 

## 2020-07-03 ENCOUNTER — Telehealth: Payer: Self-pay

## 2020-07-03 NOTE — Telephone Encounter (Signed)
Patient aware vaccine can cause some inflammation. Told to put cool compress and take tylenol. Make sure redness does not spread around injection site if no better by Monday give [place] a call.

## 2020-07-21 ENCOUNTER — Encounter: Payer: BC Managed Care – PPO | Admitting: Family Medicine

## 2020-08-01 ENCOUNTER — Other Ambulatory Visit: Payer: Self-pay

## 2020-08-01 ENCOUNTER — Encounter: Payer: Self-pay | Admitting: Family Medicine

## 2020-08-01 ENCOUNTER — Ambulatory Visit (INDEPENDENT_AMBULATORY_CARE_PROVIDER_SITE_OTHER): Payer: BC Managed Care – PPO | Admitting: Family Medicine

## 2020-08-01 VITALS — BP 121/72 | HR 79 | Temp 98.3°F | Ht 69.0 in | Wt 150.0 lb

## 2020-08-01 DIAGNOSIS — N50811 Right testicular pain: Secondary | ICD-10-CM | POA: Diagnosis not present

## 2020-08-01 MED ORDER — DICLOFENAC SODIUM 75 MG PO TBEC: 75.0000 mg | DELAYED_RELEASE_TABLET | Freq: Two times a day (BID) | ORAL | 2 refills | Status: DC | PRN

## 2020-08-01 NOTE — Progress Notes (Signed)
Subjective: CC: Testicular pain PCP: Mechele Claude, MD [id number] LUE DUBUQUE is a 30 y.o. male presenting to clinic today for:  1.  Testicular pain Patient reports about a 2-day history of right testicular pain.  He notes that this is similar to what happened in 2020 on the left.  He has had no swelling, discoloration, injury.  He had found it tender to touch but took some Motrin yesterday and this morning and symptoms seem to be getting better.  He is sexually active only with his wife and there are no concerns for STDs.  Denies any penile discharge, dysuria, nausea or vomiting.  He does lift heavy things at work but has not felt any pops or done anything that was unusual as far straining goes.   ROS: Per HPI  No Known Allergies Past Medical History:  Diagnosis Date  . Barrett's esophagus 2012  . Esophageal reflux     Current Outpatient Medications:  .  finasteride (PROPECIA) 1 MG tablet, Take 1 tablet (1 mg total) by mouth daily., Disp: 90 tablet, Rfl: 3 .  omeprazole (PRILOSEC) 40 MG capsule, Take 1 capsule (40 mg total) by mouth 2 (two) times daily., Disp: 60 capsule, Rfl: 11 Social History   Socioeconomic History  . Marital status: Single    Spouse name: Not on file  . Number of children: 0  . Years of education: Not on file  . Highest education level: Not on file  Occupational History  . Occupation: [employer]  Tobacco Use  . Smoking status: Never Smoker  . Smokeless tobacco: Never Used  Vaping Use  . Vaping Use: Never used  Substance and Sexual Activity  . Alcohol use: Yes    Alcohol/week: 0.0 standard drinks    Comment: OCC  . Drug use: No  . Sexual activity: Not on file  Other Topics Concern  . Not on file  Social History Narrative  . Not on file   Social Determinants of Health   Financial Resource Strain: Not on file  Food Insecurity: Not on file  Transportation Needs: Not on file  Physical Activity: Not on file  Stress: Not on file  Social  Connections: Not on file  Intimate Partner Violence: Not on file   Family History  Problem Relation Age of Onset  . Ulcerative colitis Father   . Colon polyps Mother   . Colon polyps Maternal Grandfather   . Colon cancer Neg Hx   . Rectal cancer Neg Hx   . Stomach cancer Neg Hx   . Esophageal cancer Neg Hx   . Pancreatic cancer Neg Hx   . Prostate cancer Neg Hx     Objective: Office vital signs reviewed. BP 121/72   Pulse 79   Temp 98.3 F (36.8 C) (Temporal)   Ht 5\' 9"  (1.753 m)   Wt 150 lb (68 kg)   SpO2 99%   BMI 22.15 kg/m   Physical Examination:  General: Awake, alert, well nourished, No acute distress GU: No gross testicular swelling, discoloration or warmth.  Right testicle slightly superior to left.  Both are palpable.  No discrete masses, abnormalities.  After quite a bit of manipulation we were able to find 1 spot that replicated the symptoms he was experiencing but there were no palpable abnormalities in this region  Assessment/ Plan: 30 y.o. male   Pain in right testicle  No acute findings on exam.  In fact the testicle really was not very symptomatic today.  I think  this is secondary to the NSAIDs he has been taking in the last 24 hours.  I placed him on diclofenac for ease of use and advised against other NSAIDs.  Okay to use Tylenol.  We discussed red flag signs and symptoms warranting further evaluation.  He will contact me if symptoms are not improving/totally resolved within the next couple of days.  If needed at that time we can pursue testicular ultrasound.  He voiced good understanding of plan and will contact me if needed  No orders of the defined types were placed in this encounter.  Meds ordered this encounter  Medications  . diclofenac (VOLTAREN) 75 MG EC tablet    Sig: Take 1 tablet (75 mg total) by mouth 2 (two) times daily as needed for moderate pain.    Dispense:  30 tablet    Refill:  2     Ashly Hulen Skains, DO [facility] [phone]

## 2020-08-01 NOTE — Patient Instructions (Signed)
Exam unremarkable today.  If no significant improvement with the diclofenac over the next couple of days, please contact me know and I will order a scrotal ultrasound.  If you develop any severe symptoms as we discussed here in the office including sudden onset testicular swelling, discoloration or severe pain, please seek immediate medical attention  Epididymitis  Epididymitis is swelling (inflammation) or infection of the epididymis. The epididymis is a cord-like structure that is located along the top and back part of the testicle. It collects and stores sperm from the testicle. This condition can also cause pain and swelling of the testicle and scrotum. Symptoms usually start suddenly (acute epididymitis). Sometimes epididymitis starts gradually and lasts for a while (chronic epididymitis). This type may be harder to treat. What are the causes? In men ages 18-40, this condition is usually caused by a bacterial infection or a sexually transmitted disease (STD), such as:  Gonorrhea.  Chlamydia. In men 39 and older who do not have anal sex, this condition is usually caused by bacteria from a blockage or from abnormalities in the urinary system. These can result from:  Having a tube placed into the bladder (urinary catheter).  Having an enlarged or inflamed prostate gland.  Having recently had urinary tract surgery.  Having a problem with a backward flow of urine (retrograde). In men who have a condition that weakens the body's defense system (immune system), such as HIV, this condition can be caused by:  Other bacteria, including tuberculosis and syphilis.  Viruses.  Fungi. Sometimes this condition occurs without infection. This may happen because of trauma or repetitive activities such as sports. What increases the risk? You are more likely to develop this condition if you have:  Unprotected sex with more than one partner.  Anal sex.  Recently had surgery.  A urinary  catheter.  Urinary problems.  A suppressed immune system. What are the signs or symptoms? This condition usually begins suddenly with chills, fever, and pain behind the scrotum and in the testicle. Other symptoms include:  Swelling of the scrotum, testicle, or both.  Pain when ejaculating or urinating.  Pain in the back or abdomen.  Nausea.  Itching and discharge from the penis.  A frequent need to pass urine.  Redness, increased warmth, and tenderness of the scrotum. How is this diagnosed? Your health care provider can diagnose this condition based on your symptoms and medical history. Your health care provider will also do a physical exam to ask about your symptoms and check your scrotum and testicle for swelling, pain, and redness. You may also have other tests, including:  Examination of discharge from the penis.  Urine tests for infections, such as STDs.  Ultrasound test for blood flow and inflammation. Your health care provider may test you for other STDs, including HIV. How is this treated? Treatment for this condition depends on the cause. If your condition is caused by a bacterial infection, oral antibiotic medicine may be prescribed. If the bacterial infection has spread to your blood, you may need to receive IV antibiotics. For both bacterial and nonbacterial epididymitis, you may be treated with:  Rest.  Elevation of the scrotum.  Pain medicines.  Anti-inflammatory medicines. Surgery may be needed to treat:  Bacterial epididymitis that causes pus to build up in the scrotum (abscess).  Chronic epididymitis that has not responded to other treatments. Follow these instructions at home: Medicines  Take over-the-counter and prescription medicines only as told by your health care provider.  If  you were prescribed an antibiotic medicine, take it as told by your health care provider. Do not stop taking the antibiotic even if your condition improves. Sexual  activity  If your epididymitis was caused by an STD, avoid sexual activity until your treatment is complete.  Inform your sexual partner or partners if you test positive for an STD. They may need to be treated. Do not engage in sexual activity with your partner or partners until their treatment is completed. Managing pain and swelling  If directed, elevate your scrotum and apply ice. ? Put ice in a plastic bag. ? Place a small towel or pillow between your legs. ? Rest your scrotum on the pillow or towel. ? Place another towel between your skin and the plastic bag. ? Leave the ice on for 20 minutes, 2-3 times a day.  Try taking a sitz bath to help with discomfort. This is a warm water bath that is taken while you are sitting down. The water should only come up to your hips and should cover your buttocks. Do this 3-4 times per day or as told by your health care provider.  Keep your scrotum elevated and supported while resting. Ask your health care provider if you should wear a scrotal support, such as a jockstrap. Wear it as told by your health care provider.   General instructions  Return to your normal activities as told by your health care provider. Ask your health care provider what activities are safe for you.  Drink enough fluid to keep your urine pale yellow.  Keep all follow-up visits as told by your health care provider. This is important. Contact a health care provider if:  You have a fever.  Your pain medicine is not helping.  Your pain is getting worse.  Your symptoms do not improve within 3 days. Summary  Epididymitis is swelling (inflammation) or infection of the epididymis. This condition can also cause pain and swelling of the testicle and scrotum.  Treatment for this condition depends on the cause. If your condition is caused by a bacterial infection, oral antibiotic medicine may be prescribed.  Inform your sexual partner or partners if you test positive for an  STD. They may need to be treated. Do not engage in sexual activity with your partner or partners until their treatment is completed.  Contact a health care provider if your symptoms do not improve within 3 days. This information is not intended to replace advice given to you by your health care provider. Make sure you discuss any questions you have with your health care provider. Document Revised: 04/24/2018 Document Reviewed: 04/25/2018 Elsevier Patient Education  2021 [employer]  Spermatocele  A spermatocele is a fluid-filled sac, or a cyst, inside the sac that holds the testicles (scrotum). This type of cyst often forms in the epididymis. The epididymis is a coiled tube at the top of each testicle, and this tube is where sperm are stored. The cyst sometimes forms along a tube called the vas deferens, which is a tube that carries sperm away from the epididymis. Spermatoceles are usually painless. Most cysts are small, but they can grow larger. Spermatoceles are not cancerous (are benign). What are the causes? The cause of this condition is not known. However, this condition usually results from a blockage in one of the many small tubes (tubules) that carry sperm from your testicle to your vas deferens. What are the signs or symptoms? In most cases, small cysts do not cause  symptoms. However, symptoms sometimes occur. Symptoms of this condition include:  Dull pain.  A feeling of heaviness.  An enlarged scrotum, if your cyst is large. How is this diagnosed? This condition is diagnosed based on a physical exam.  You or your health care provider may notice your cyst when feeling your scrotum.  Your health care provider may shine a light through (transilluminate) your scrotum to see if light will pass through your cyst. An ultrasound of the scrotum may also be done to rule out a tumor. How is this treated? Small spermatoceles do not need to be treated. If your spermatocele has grown  large or is uncomfortable, your health care provider may recommend surgery to remove it. Follow these instructions at home:  Watch your spermatocele for any changes.  Do regular self-exams of your scrotum.  Keep all follow-up visits as told by your health care provider. This is important. Contact a health care provider if:  Your spermatocele gets larger.  You have pain in your scrotum.  Your spermatocele comes back after treatment. Get help right away if:  You experience severe pain and redness of your scrotum. Summary  A spermatocele is a fluid-filled sac, or a cyst, inside the sac that holds the testicles (scrotum). This condition is usually painless, and it is not cancerous (is benign).  Your health care provider may recommend surgery to remove your spermatocele if it grows large or is uncomfortable.  If you have a spermatocele, watch for any changes and do self-exams of your scrotum.  Keep all follow-up visits as told by your health care provider. This is important. This information is not intended to replace advice given to you by your health care provider. Make sure you discuss any questions you have with your health care provider. Document Revised: 05/04/2019 Document Reviewed: 05/04/2019 Elsevier Patient Education  2021 [employer].

## 2020-08-05 ENCOUNTER — Ambulatory Visit (INDEPENDENT_AMBULATORY_CARE_PROVIDER_SITE_OTHER): Payer: BC Managed Care – PPO | Admitting: Family Medicine

## 2020-08-05 ENCOUNTER — Encounter: Payer: Self-pay | Admitting: Family Medicine

## 2020-08-05 ENCOUNTER — Other Ambulatory Visit: Payer: Self-pay

## 2020-08-05 VITALS — BP 129/80 | HR 88 | Temp 97.6°F | Ht 69.0 in | Wt 150.8 lb

## 2020-08-05 DIAGNOSIS — L649 Androgenic alopecia, unspecified: Secondary | ICD-10-CM | POA: Diagnosis not present

## 2020-08-05 DIAGNOSIS — Z0001 Encounter for general adult medical examination with abnormal findings: Secondary | ICD-10-CM

## 2020-08-05 DIAGNOSIS — E559 Vitamin D deficiency, unspecified: Secondary | ICD-10-CM

## 2020-08-05 DIAGNOSIS — Z1159 Encounter for screening for other viral diseases: Secondary | ICD-10-CM

## 2020-08-05 DIAGNOSIS — Z0000 Encounter for general adult medical examination without abnormal findings: Secondary | ICD-10-CM

## 2020-08-05 MED ORDER — FINASTERIDE 1 MG PO TABS: 1.0000 mg | ORAL_TABLET | Freq: Every day | ORAL | 3 refills | Status: DC

## 2020-08-05 NOTE — Progress Notes (Signed)
Subjective:  Patient ID: Reginald Mcdonald, male    DOB: [birth year]  Age: 30 y.o. MRN: [id number]  CC: No chief complaint on file.   HPI Reginald Mcdonald presents for Complete physical. Followed by GI for Barretts Esophagus. Taking omeprazole. Due for Endoscopy this coming fall. Symptoms of recent right testicular pain have resolved. Lasted about a day after the was last here.  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  Depression screen [facility] 2/9 08/01/2020 06/20/2019 12/21/2017  Decreased Interest 0 0 0  Down, Depressed, Hopeless 0 0 0  PHQ - 2 Score 0 0 0  Altered sleeping 0 - -  Tired, decreased energy 0 - -  Change in appetite 0 - -  Feeling bad or failure about yourself  0 - -  Trouble concentrating 0 - -  Moving slowly or fidgety/restless 0 - -  Suicidal thoughts 0 - -  PHQ-9 Score 0 - -    History Reginald Mcdonald has a past medical history of Barrett's esophagus (2012) and Esophageal reflux.   He has a past surgical history that includes Wrist surgery (Right); Finger fracture surgery (Left); and Upper gi endoscopy.   His family history includes Colon polyps in his maternal grandfather and mother; Ulcerative colitis in his father.He reports that he has never smoked. He has never used smokeless tobacco. He reports current alcohol use. He reports that he does not use drugs.    ROS Review of Systems  Constitutional: Negative for activity change, fatigue and unexpected weight change.  HENT: Negative for congestion, ear pain, hearing loss, postnasal drip and trouble swallowing.   Eyes: Negative for pain and visual disturbance.  Respiratory: Negative for cough, chest tightness and shortness of breath.   Cardiovascular: Negative for chest pain, palpitations and leg swelling.  Gastrointestinal: Negative for abdominal distention, abdominal pain, blood in stool, constipation, diarrhea, nausea and vomiting.  Endocrine: Negative for  cold intolerance, heat intolerance and polydipsia.  Genitourinary: Negative for difficulty urinating, dysuria, flank pain, frequency and urgency.  Musculoskeletal: Negative for arthralgias and joint swelling.  Skin: Negative for color change, rash and wound.  Neurological: Negative for dizziness, syncope, speech difficulty, weakness, light-headedness, numbness and headaches.  Hematological: Does not bruise/bleed easily.  Psychiatric/Behavioral: Negative for confusion, decreased concentration, dysphoric mood and sleep disturbance. The patient is not nervous/anxious.     Objective:  BP 129/80   Pulse 88   Temp 97.6 F (36.4 C)   Ht _0  (1.753 m)   Wt 150 lb 12.8 oz (68.4 kg)   BMI 22.27 kg/m   BP Readings from Last 3 Encounters:  08/05/20 129/80  08/01/20 121/72  06/20/19 123/82    Wt Readings from Last 3 Encounters:  08/05/20 150 lb 12.8 oz (68.4 kg)  08/01/20 150 lb (68 kg)  06/20/19 148 lb (67.1 kg)     Physical Exam Constitutional:      Appearance: He is well-developed and well-nourished.  HENT:     Head: Normocephalic and atraumatic.     Mouth/Throat:     Mouth: Oropharynx is clear and moist.  Eyes:     Extraocular Movements: EOM normal.     Pupils: Pupils are equal, round, and reactive to light.  Neck:     Thyroid: No thyromegaly.     Trachea: No tracheal deviation.  Cardiovascular:     Rate and Rhythm: Normal rate and regular rhythm.     Heart sounds: Normal  heart sounds. No murmur heard. No friction rub. No gallop.   Pulmonary:     Breath sounds: Normal breath sounds. No wheezing or rales.  Abdominal:     General: Bowel sounds are normal. There is no distension.     Palpations: Abdomen is soft. There is no mass.     Tenderness: There is no abdominal tenderness.     Hernia: There is no hernia in the right inguinal area or left inguinal area.  Genitourinary:    Penis: Normal.      Testes: Normal.  Musculoskeletal:        General: No edema. Normal  range of motion.     Cervical back: Normal range of motion.  Lymphadenopathy:     Cervical: No cervical adenopathy.  Skin:    General: Skin is warm and dry.  Neurological:     Mental Status: He is alert and oriented to person, place, and time.  Psychiatric:        Mood and Affect: Mood and affect normal.       Assessment & Plan:   Diagnoses and all orders for this visit:  Well adult exam -     CBC with Differential/Platelet -     CMP14+EGFR -     Lipid panel -     Urinalysis  Need for hepatitis C screening test -     Hepatitis C antibody  Vitamin D deficiency -     VITAMIN D 25 Hydroxy (Vit-D Deficiency, Fractures)  Male pattern baldness -     finasteride (PROPECIA) 1 MG tablet; Take 1 tablet (1 mg total) by mouth daily.       I have discontinued Fumio M. Thurner's diclofenac. I am also having him maintain his omeprazole and finasteride.  Allergies as of 08/05/2020   No Known Allergies     Medication List       Accurate as of August 05, 2020  1:56 PM. If you have any questions, ask your nurse or doctor.        STOP taking these medications   diclofenac 75 MG EC tablet Commonly known as: VOLTAREN Stopped by: Claretta Fraise, MD     TAKE these medications   finasteride 1 MG tablet Commonly known as: PROPECIA Take 1 tablet (1 mg total) by mouth daily.   omeprazole 40 MG capsule Commonly known as: PRILOSEC Take 40 mg by mouth daily. What changed: Another medication with the same name was removed. Continue taking this medication, and follow the directions you see here. Changed by: Claretta Fraise, MD        Follow-up: Return in about 1 year (around 08/05/2021).  Claretta Fraise, M.D.

## 2021-04-02 ENCOUNTER — Encounter: Payer: Self-pay | Admitting: Nurse Practitioner

## 2021-04-02 ENCOUNTER — Ambulatory Visit (INDEPENDENT_AMBULATORY_CARE_PROVIDER_SITE_OTHER): Payer: BC Managed Care – PPO | Admitting: Nurse Practitioner

## 2021-04-02 DIAGNOSIS — U071 COVID-19: Secondary | ICD-10-CM | POA: Diagnosis not present

## 2021-04-02 DIAGNOSIS — Z7189 Other specified counseling: Secondary | ICD-10-CM

## 2021-04-02 NOTE — Progress Notes (Signed)
Virtual Visit  Note Due to COVID-19 pandemic this visit was conducted virtually. This visit type was conducted due to national recommendations for restrictions regarding the COVID-19 Pandemic (e.g. social distancing, sheltering in place) in an effort to limit this patient's exposure and mitigate transmission in our community. All issues noted in this document were discussed and addressed.  A physical exam was not performed with this format.  I connected with Reginald Mcdonald on 04/02/21 at 9:35 by telephone and verified that I am speaking with the correct person using two identifiers. Reginald Mcdonald is currently located at work and no one is currently with him during visit. The provider, Mary-Margaret Daphine Deutscher, FNP is located in their office at time of visit.  I discussed the limitations, risks, security and privacy concerns of performing an evaluation and management service by telephone and the availability of in person appointments. I also discussed with the patient that there may be a patient responsible charge related to this service. The patient expressed understanding and agreed to proceed.   History and Present Illness:  Patient developed a headache yesterday at work. He feels fine this morning. Tested positive for covid last night.    Review of Systems  Constitutional:  Negative for chills, fever and malaise/fatigue.  HENT:  Positive for congestion (mild).   Respiratory:  Negative for cough, sputum production and shortness of breath.   Musculoskeletal:  Negative for myalgias.  Neurological:  Negative for headaches (no longer has).  All other systems reviewed and are negative.   Observations/Objective: Alert and oriented- answers all questions appropriately No distress    Assessment and Plan: Reginald Mcdonald in today with chief complaint of No chief complaint on file.   1. COVID-19 virus RNA test result positive at limit of detection 1. Take meds as prescribed 2. Use a cool  mist humidifier especially during the winter months and when heat has been humid. 3. Use saline nose sprays frequently 4. Saline irrigations of the nose can be very helpful if done frequently.  * 4X daily for 1 week*  * Use of a nettie pot can be helpful with this. Follow directions with this* 5. Drink plenty of fluids 6. Keep thermostat turn down low 7.For any cough or congestion  Use plain Mucinex- regular strength or max strength is fine   * Children- consult with Pharmacist for dosing 8. For fever or aces or pains- take tylenol or ibuprofen appropriate for age and weight.  * for fevers greater than 101 orally you may alternate ibuprofen and tylenol every  3 hours.   If develop symptoms treat    Follow Up Instructions: Prn     I discussed the assessment and treatment plan with the patient. The patient was provided an opportunity to ask questions and all were answered. The patient agreed with the plan and demonstrated an understanding of the instructions.   The patient was advised to call back or seek an in-person evaluation if the symptoms worsen or if the condition fails to improve as anticipated.  The above assessment and management plan was discussed with the patient. The patient verbalized understanding of and has agreed to the management plan. Patient is aware to call the clinic if symptoms persist or worsen. Patient is aware when to return to the clinic for a follow-up visit. Patient educated on when it is appropriate to go to the emergency department.   Time call ended:  9:47  I provided 12 minutes of  non face-to-face  time during this encounter.    Mary-Margaret Hassell Done, FNP

## 2021-06-02 ENCOUNTER — Encounter: Payer: Self-pay | Admitting: Family Medicine

## 2021-06-02 ENCOUNTER — Ambulatory Visit (INDEPENDENT_AMBULATORY_CARE_PROVIDER_SITE_OTHER): Payer: BC Managed Care – PPO | Admitting: Family Medicine

## 2021-06-02 ENCOUNTER — Other Ambulatory Visit (HOSPITAL_COMMUNITY)
Admission: RE | Admit: 2021-06-02 | Discharge: 2021-06-02 | Disposition: A | Discharge status: Home / Self Care | Payer: BC Managed Care – PPO | Source: Ambulatory Visit | Attending: Family Medicine | Admitting: Family Medicine

## 2021-06-02 DIAGNOSIS — R300 Dysuria: Secondary | ICD-10-CM | POA: Insufficient documentation

## 2021-06-02 LAB — URINALYSIS, ROUTINE W REFLEX MICROSCOPIC
Bilirubin, UA: NEGATIVE
Glucose, UA: NEGATIVE
Ketones, UA: NEGATIVE
Leukocytes,UA: NEGATIVE
Nitrite, UA: NEGATIVE
Protein,UA: NEGATIVE
RBC, UA: NEGATIVE
Specific Gravity, UA: 1.02 (ref 1.005–1.030)
Urobilinogen, Ur: 0.2 mg/dL (ref 0.2–1.0)
pH, UA: 6 (ref 5.0–7.5)

## 2021-06-02 NOTE — Progress Notes (Signed)
Virtual Visit via telephone Note Due to COVID-19 pandemic this visit was conducted virtually. This visit type was conducted due to national recommendations for restrictions regarding the COVID-19 Pandemic (e.g. social distancing, sheltering in place) in an effort to limit this patient's exposure and mitigate transmission in our community. All issues noted in this document were discussed and addressed.  A physical exam was not performed with this format.   I connected with Reginald Mcdonald on 06/02/2021 at 1355 by telephone and verified that I am speaking with the correct person using two identifiers. Reginald Mcdonald is currently located at home and patient is currently with them during visit. The provider, Monia Pouch, FNP is located in their office at time of visit.  I discussed the limitations, risks, security and privacy concerns of performing an evaluation and management service by telephone and the availability of in person appointments. I also discussed with the patient that there may be a patient responsible charge related to this service. The patient expressed understanding and agreed to proceed.  Subjective:  Patient ID: Reginald Mcdonald, male    DOB: 29-Aug-1990, 30 y.o.   MRN: [id number]  Chief Complaint:  Dysuria   HPI: Reginald Mcdonald is a 30 y.o. male presenting on 06/02/2021 for Dysuria   Pt reports intermittent aching prior to and after voiding for the last 2 days. No testicular pain, erythema, or swelling. No penile discharge.   Dysuria  This is a new problem. The current episode started yesterday. The problem occurs intermittently. The problem has been waxing and waning. The quality of the pain is described as aching. The pain is at a severity of 2/10. The pain is mild. There has been no fever. He is Sexually active. There is No history of pyelonephritis. Pertinent negatives include no chills, discharge, flank pain, frequency, hematuria, hesitancy, nausea, possible pregnancy,  sweats, urgency or vomiting. He has tried nothing for the symptoms.    Relevant past medical, surgical, family, and social history reviewed and updated as indicated.  Allergies and medications reviewed and updated.   Past Medical History:  Diagnosis Date   Barrett's esophagus 2012   Esophageal reflux     Past Surgical History:  Procedure Laterality Date   FINGER FRACTURE SURGERY Left    UPPER GI ENDOSCOPY     WRIST SURGERY Right    cyst removed    Social History   Socioeconomic History   Marital status: Single    Spouse name: Not on file   Number of children: 0   Years of education: Not on file   Highest education level: Not on file  Occupational History   Occupation: [employer]  Tobacco Use   Smoking status: Never   Smokeless tobacco: Never  Vaping Use   Vaping Use: Never used  Substance and Sexual Activity   Alcohol use: Yes    Alcohol/week: 0.0 standard drinks    Comment: OCC   Drug use: No   Sexual activity: Not on file  Other Topics Concern   Not on file  Social History Narrative   Not on file   Social Determinants of Health   Financial Resource Strain: Not on file  Food Insecurity: Not on file  Transportation Needs: Not on file  Physical Activity: Not on file  Stress: Not on file  Social Connections: Not on file  Intimate Partner Violence: Not on file    Outpatient Encounter Medications as of 06/02/2021  Medication Sig   finasteride ([place]) 1  MG tablet Take 1 tablet (1 mg total) by mouth daily.   omeprazole (PRILOSEC) 40 MG capsule Take 40 mg by mouth daily.   No facility-administered encounter medications on file as of 06/02/2021.    No Known Allergies  Review of Systems  Constitutional:  Negative for activity change, appetite change, chills, diaphoresis, fatigue, fever and unexpected weight change.  Gastrointestinal:  Negative for abdominal pain, nausea and vomiting.  Genitourinary:  Positive for dysuria. Negative for decreased  urine volume, difficulty urinating, enuresis, flank pain, frequency, genital sores, hematuria, hesitancy, penile discharge, penile pain, penile swelling, scrotal swelling, testicular pain and urgency.  Musculoskeletal:  Negative for back pain.  Neurological:  Negative for weakness.  Psychiatric/Behavioral:  Negative for confusion.   All other systems reviewed and are negative.       Observations/Objective: No vital signs or physical exam, this was a telephone or virtual health encounter.  Pt alert and oriented, answers all questions appropriately, and able to speak in full sentences.    Assessment and Plan: Chi was seen today for dysuria.  Diagnoses and all orders for this visit:  Dysuria Pt to come to office to provide urine sample for testing. No reported symptoms concerning for testicular torsion. Symptomatic care discussed in detail. Further treatment pending lab results. Report any new, worsening, or persistent symptoms. Pt aware of symptoms which require emergent evaluation.  -     Urine cytology ancillary only -     Urinalysis, Routine w reflex microscopic -     Urine Culture     Follow Up Instructions: Return if symptoms worsen or fail to improve.    I discussed the assessment and treatment plan with the patient. The patient was provided an opportunity to ask questions and all were answered. The patient agreed with the plan and demonstrated an understanding of the instructions.   The patient was advised to call back or seek an in-person evaluation if the symptoms worsen or if the condition fails to improve as anticipated.  The above assessment and management plan was discussed with the patient. The patient verbalized understanding of and has agreed to the management plan. Patient is aware to call the clinic if they develop any new symptoms or if symptoms persist or worsen. Patient is aware when to return to the clinic for a follow-up visit. Patient educated on when it is  appropriate to go to the emergency department.    I provided 11 minutes of non-face-to-face time during this encounter. The call started at 1355. The call ended at 1405. The other time was used for coordination of care.    Kari Baars, FNP-C [facility] [address] [place], [place] [address] [phone] 06/02/2021

## 2021-06-04 LAB — URINE CYTOLOGY ANCILLARY ONLY
Chlamydia: NEGATIVE
Comment: NEGATIVE
Comment: NEGATIVE
Comment: NORMAL
Neisseria Gonorrhea: NEGATIVE
Trichomonas: NEGATIVE

## 2021-06-05 LAB — URINE CULTURE: Organism ID, Bacteria: NO GROWTH

## 2021-09-24 ENCOUNTER — Other Ambulatory Visit: Payer: Self-pay | Admitting: Family Medicine

## 2021-09-24 DIAGNOSIS — L649 Androgenic alopecia, unspecified: Secondary | ICD-10-CM

## 2021-12-14 ENCOUNTER — Encounter: Payer: Self-pay | Admitting: Gastroenterology

## 2021-12-31 ENCOUNTER — Encounter: Payer: Self-pay | Admitting: Family Medicine

## 2021-12-31 ENCOUNTER — Ambulatory Visit (INDEPENDENT_AMBULATORY_CARE_PROVIDER_SITE_OTHER): Payer: BC Managed Care – PPO | Admitting: Family Medicine

## 2021-12-31 ENCOUNTER — Ambulatory Visit: Payer: BC Managed Care – PPO | Admitting: Family Medicine

## 2021-12-31 VITALS — BP 125/82 | HR 74 | Temp 97.6°F | Ht 69.0 in | Wt 145.8 lb

## 2021-12-31 DIAGNOSIS — S80921A Unspecified superficial injury of right lower leg, initial encounter: Secondary | ICD-10-CM | POA: Diagnosis not present

## 2021-12-31 DIAGNOSIS — T148XXA Other injury of unspecified body region, initial encounter: Secondary | ICD-10-CM

## 2021-12-31 DIAGNOSIS — M67431 Ganglion, right wrist: Secondary | ICD-10-CM

## 2021-12-31 NOTE — Progress Notes (Signed)
Subjective:  Patient ID: Reginald Mcdonald, male    DOB: 28-Oct-1990, 31 y.o.   MRN: [id number]  Patient Care Team: Mechele Claude, MD as PCP - General (Family Medicine)   Chief Complaint:  knot on leg   HPI: Reginald Mcdonald is a 31 y.o. male presenting on 12/31/2021 for knot on leg   Pt presents today for ongoing knot to right lower leg. States a piece of lumbar fell on his leg about a month ago and the knot has not resolved. States tender if pressed on. No trouble walking. No other associated symptoms. He also has a cyst on his right wrist. States this was removed several years ago by Dr. Teressa Senter. States swelling returned about 4 weeks ago. 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     Relevant past medical, surgical, family, and social history reviewed and updated as indicated.  Allergies and medications reviewed and updated. Data reviewed: Chart in Epic.   Past Medical History:  Diagnosis Date   Barrett's esophagus 2012   Esophageal reflux     Past Surgical History:  Procedure Laterality Date   FINGER FRACTURE SURGERY Left    UPPER GI ENDOSCOPY     WRIST SURGERY Right    cyst removed    Social History   Socioeconomic History   Marital status: Single    Spouse name: Not on file   Number of children: 0   Years of education: Not on file   Highest education level: Not on file  Occupational History   Occupation: [employer]  Tobacco Use   Smoking status: Never   Smokeless tobacco: Never  Vaping Use   Vaping Use: Never used  Substance and Sexual Activity   Alcohol use: Yes    Alcohol/week: 0.0 standard drinks of alcohol    Comment: OCC   Drug use: No   Sexual activity: Not on file  Other Topics Concern   Not on file  Social History Narrative   Not on file   Social Determinants of Health   Financial Resource Strain: Not on file  Food Insecurity: Not on file  Transportation Needs: Not on file  Physical Activity: Not on file  Stress: Not  on file  Social Connections: Not on file  Intimate Partner Violence: Not on file    Outpatient Encounter Medications as of 12/31/2021  Medication Sig   finasteride (PROPECIA) 1 MG tablet Take 1 tablet (1 mg total) by mouth daily.   omeprazole (PRILOSEC) 40 MG capsule Take 40 mg by mouth daily.   No facility-administered encounter medications on file as of 12/31/2021.    No Known Allergies  Review of Systems  Constitutional:  Negative for activity change, appetite change, chills, fatigue, fever and unexpected weight change.  HENT: Negative.    Eyes: Negative.  Negative for photophobia and visual disturbance.  Respiratory:  Negative for cough, chest tightness and shortness of breath.   Cardiovascular:  Negative for chest pain, palpitations and leg swelling.  Gastrointestinal:  Negative for abdominal pain, blood in stool, constipation, diarrhea, nausea and vomiting.  Endocrine: Negative.   Genitourinary:  Negative for decreased urine volume, difficulty urinating, dysuria, frequency and urgency.  Musculoskeletal:  Positive for joint swelling. Negative for arthralgias, back pain, gait problem, myalgias, neck pain and neck stiffness.  Skin:  Positive for wound.  Allergic/Immunologic: Negative.   Neurological:  Negative for dizziness, tremors, seizures, syncope, facial asymmetry, speech difficulty, weakness, light-headedness, numbness and headaches.  Hematological: Negative.   Psychiatric/Behavioral:  Negative for confusion, hallucinations, sleep disturbance and suicidal ideas.   All other systems reviewed and are negative.       Objective:  BP 125/82   Pulse 74   Temp 97.6 F (36.4 C)   Ht 5\' 9"  (1.753 m)   Wt 145 lb 12.8 oz (66.1 kg)   SpO2 99%   BMI 21.53 kg/m    Wt Readings from Last 3 Encounters:  12/31/21 145 lb 12.8 oz (66.1 kg)  08/05/20 150 lb 12.8 oz (68.4 kg)  08/01/20 150 lb (68 kg)    Physical Exam Vitals and nursing note reviewed.  Constitutional:       General: He is not in acute distress.    Appearance: Normal appearance. He is normal weight. He is not ill-appearing, toxic-appearing or diaphoretic.  HENT:     Head: Normocephalic and atraumatic.  Eyes:     Pupils: Pupils are equal, round, and reactive to light.  Cardiovascular:     Rate and Rhythm: Normal rate and regular rhythm.     Heart sounds: Normal heart sounds. No murmur heard.    No friction rub. No gallop.  Pulmonary:     Effort: Pulmonary effort is normal.     Breath sounds: Normal breath sounds.  Musculoskeletal:       Arms:       Legs:  Skin:    General: Skin is warm and dry.     Capillary Refill: Capillary refill takes less than 2 seconds.  Neurological:     General: No focal deficit present.     Mental Status: He is alert and oriented to person, place, and time.  Psychiatric:        Mood and Affect: Mood normal.        Behavior: Behavior normal.        Thought Content: Thought content normal.        Judgment: Judgment normal.     Results for orders placed or performed in visit on 06/02/21  Urine Culture   Specimen: Urine   UR  Result Value Ref Range   Urine Culture, Routine Final report    Organism ID, Bacteria No growth   Urinalysis, Routine w reflex microscopic  Result Value Ref Range   Specific Gravity, UA 1.020 1.005 - 1.030   pH, UA 6.0 5.0 - 7.5   Color, UA Yellow Yellow   Appearance Ur Clear Clear   Leukocytes,UA Negative Negative   Protein,UA Negative Negative/Trace   Glucose, UA Negative Negative   Ketones, UA Negative Negative   RBC, UA Negative Negative   Bilirubin, UA Negative Negative   Urobilinogen, Ur 0.2 0.2 - 1.0 mg/dL   Nitrite, UA Negative Negative  Urine cytology ancillary only  Result Value Ref Range   Neisseria Gonorrhea Negative    Chlamydia Negative    Trichomonas Negative    Comment Normal Reference Range Trichomonas - Negative    Comment Normal Reference Ranger Chlamydia - Negative    Comment      Normal Reference  Range Neisseria Gonorrhea - Negative       Pertinent labs & imaging results that were available during my care of the patient were reviewed by me and considered in my medical decision making.  Assessment & Plan:  Reginald Mcdonald was seen today for knot on leg.  Diagnoses and all orders for this visit:  Hematoma Hematoma to right anterior shin. Gradually improving. Symptomatic care discussed in detail. Can use Arnica  Cream to see if beneficial. Moist heat will also be beneficial. Report any new, worsening, or persistent symptoms.   Ganglion cyst of wrist, right Small ganglion cyst noted to right wrist, has had removed in the past. Will refer back to ortho. Symptomatic care discussed in detail.  -     Ambulatory referral to Orthopedic Surgery     Continue all other maintenance medications.  Follow up plan: Return if symptoms worsen or fail to improve.   Continue healthy lifestyle choices, including diet (rich in fruits, vegetables, and lean proteins, and low in salt and simple carbohydrates) and exercise (at least 30 minutes of moderate physical activity daily).  Educational handout given for ganglion cyst  The above assessment and management plan was discussed with the patient. The patient verbalized understanding of and has agreed to the management plan. Patient is aware to call the clinic if they develop any new symptoms or if symptoms persist or worsen. Patient is aware when to return to the clinic for a follow-up visit. Patient educated on when it is appropriate to go to the emergency department.   Kari Baars, FNP-C [facility] [phone]

## 2021-12-31 NOTE — Patient Instructions (Signed)
Arnica cream 

## 2022-01-12 ENCOUNTER — Ambulatory Visit (INDEPENDENT_AMBULATORY_CARE_PROVIDER_SITE_OTHER): Payer: BC Managed Care – PPO | Admitting: Gastroenterology

## 2022-01-12 ENCOUNTER — Encounter: Payer: Self-pay | Admitting: Gastroenterology

## 2022-01-12 VITALS — BP 100/70 | HR 55 | Ht 69.0 in | Wt 147.0 lb

## 2022-01-12 DIAGNOSIS — K2270 Barrett's esophagus without dysplasia: Secondary | ICD-10-CM

## 2022-01-12 DIAGNOSIS — K219 Gastro-esophageal reflux disease without esophagitis: Secondary | ICD-10-CM

## 2022-01-12 MED ORDER — PANTOPRAZOLE SODIUM 40 MG PO TBEC: 40.0000 mg | DELAYED_RELEASE_TABLET | Freq: Every day | ORAL | 11 refills | Status: AC

## 2022-01-12 NOTE — Progress Notes (Signed)
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  Stressed importance of long-term daily PPI therapy if Barrett's is confirmed.  Follow antireflux measures long-term. Schedule EGD. The risks (including bleeding, perforation, infection, missed lesions, medication reactions and possible hospitalization or surgery if complications occur), benefits, and alternatives to endoscopy with possible biopsy and possible dilation were discussed with the patient and they consent to proceed.      HPI   Chief complaint: GERD, Barrett's esophagus  Patient profile:  Reginald Mcdonald is a 31 y.o. male referred by Mechele Claude, MD for GERD with short segment Barrett's esophagus without dysplasia.  His last office visit was over 3 years ago.  His last EGD was in 2017 as below.  EGD in 2012 showed intestinal metaplasia.  Follow-up EGDs in 2014 and 2017 did not show intestinal metaplasia.  He states he takes omeprazole as needed and controls his symptoms mainly with diet.  He feels that taking omeprazole decreased his appetite so he takes it only intermittently. Denies weight loss, abdominal pain, constipation, diarrhea, change in stool caliber, melena, hematochezia, nausea, vomiting, dysphagia, chest pain.    Previous Labs / Imaging::    Latest Ref Rng & Units 08/10/2018    9:24 AM 03/25/2014    8:51 AM 11/12/2013   11:38 AM  CBC  WBC 4.0 - 10.5 K/uL 5.1  8.3  6.8   Hemoglobin 13.0 - 17.0 g/dL 69.4  85.4  62.7   Hematocrit 39.0 - 52.0 % 44.2  47.2  43.9   Platelets 150.0 - 400.0 K/uL 207.0   209.0     No results found for: "LIPASE"    Latest Ref Rng & Units 08/10/2018    9:24 AM 03/25/2014    8:54 AM 10/19/2012   11:28 AM  CMP  Glucose 70 - 99 mg/dL 86   76   BUN 6 - 23 mg/dL 11   12   Creatinine 0.35 - 1.50 mg/dL 0.09   0.7   Sodium 381 - 145 mEq/L 140   139   Potassium 3.5 - 5.1 mEq/L 4.0   3.9   Chloride 96 - 112  mEq/L 104   105   CO2 19 - 32 mEq/L 30   28   Calcium 8.4 - 10.5 mg/dL 9.5   9.1   Total Protein 6.0 - 8.3 g/dL 7.0  7.2  7.2   Total Bilirubin 0.2 - 1.2 mg/dL 0.7  0.5  0.8   Alkaline Phos 39 - 117 U/L 68  80  65   AST 0 - 37 U/L 16  21  15    ALT 0 - 53 U/L 24  25  21       Previous GI evaluation    Endoscopies:  EGD May 2017 - Esophageal mucosal changes secondary to established short-segment Barrett's disease. Biopsied. - Small hiatal hernia.  Imaging:     Past Medical History:  Diagnosis Date   Barrett's esophagus 2012   Esophageal reflux    Past Surgical History:  Procedure Laterality Date   FINGER FRACTURE SURGERY Left    UPPER GI ENDOSCOPY     WRIST SURGERY Right    cyst removed   Family History  Problem Relation Age of Onset   Ulcerative colitis Father    Colon polyps Mother    Colon polyps Maternal Grandfather    Colon cancer Neg Hx    Rectal cancer Neg  Hx    Stomach cancer Neg Hx    Esophageal cancer Neg Hx    Pancreatic cancer Neg Hx    Prostate cancer Neg Hx    Social History   Tobacco Use   Smoking status: Never   Smokeless tobacco: Never  Vaping Use   Vaping Use: Never used  Substance Use Topics   Alcohol use: Yes    Alcohol/week: 0.0 standard drinks of alcohol    Comment: OCC   Drug use: No   Current Outpatient Medications  Medication Sig Dispense Refill   finasteride (PROPECIA) 1 MG tablet Take 1 tablet (1 mg total) by mouth daily. 90 tablet 3   omeprazole (PRILOSEC) 40 MG capsule Take 40 mg by mouth daily.     No current facility-administered medications for this visit.   No Known Allergies  Review of Systems: All other systems reviewed and negative except where noted in HPI.    Physical Exam    Wt Readings from Last 3 Encounters:  12/31/21 145 lb 12.8 oz (66.1 kg)  08/05/20 150 lb 12.8 oz (68.4 kg)  08/01/20 150 lb (68 kg)    There were no vitals taken for this visit. Constitutional:  Generally well appearing male in  no acute distress. Psychiatric: Pleasant. Normal mood and affect. Behavior is normal. EENT: Pupils normal.  Conjunctivae are normal. No scleral icterus. Neck supple.  Cardiovascular: Normal rate, regular rhythm. No edema Pulmonary/chest: Effort normal and breath sounds normal. No wheezing, rales or rhonchi. Abdominal: Soft, nondistended, nontender. Bowel sounds active throughout. There are no masses palpable. No hepatomegaly. Rectal: not done Neurological: Alert and oriented to person place and time. Skin: Skin is warm and dry. No rashes noted.  Claudette Head, MD   cc:  Referring Provider Mechele Claude, MD

## 2022-01-12 NOTE — Patient Instructions (Signed)
We have sent the following medications to your pharmacy for you to pick up at your convenience: pantoprazole.   You have been scheduled for an endoscopy. Please follow written instructions given to you at your visit today. If you use inhalers (even only as needed), please bring them with you on the day of your procedure.  Patient advised to avoid spicy, acidic, citrus, chocolate, mints, fruit and fruit juices.  Limit the intake of caffeine, alcohol and Soda.  Don't exercise too soon after eating.  Don't lie down within 3-4 hours of eating.  Elevate the head of your bed.  The [facility] providers would like to encourage you to use [facility] to communicate with providers for non-urgent requests or questions.  Due to long hold times on the telephone, sending your provider a message by [facility] may be a faster and more efficient way to get a response.  Please allow 48 business hours for a response.  Please remember that this is for non-urgent requests.   Due to recent changes in healthcare laws, you may see the results of your imaging and laboratory studies on MyChart before your provider has had a chance to review them.  We understand that in some cases there may be results that are confusing or concerning to you. Not all laboratory results come back in the same time frame and the provider may be waiting for multiple results in order to interpret others.  Please give [place] 48 hours in order for your provider to thoroughly review all the results before contacting the office for clarification of your results.   Thank you for choosing me and [facility].  Venita Lick. Pleas Koch., MD., Clementeen Graham

## 2022-01-20 ENCOUNTER — Telehealth: Payer: Self-pay

## 2022-01-20 NOTE — Telephone Encounter (Signed)
Patient Advocate Encounter   Received notification that prior authorization is required for Pantoprazole Sodium 40MG  dr tablets  Key [id number] Submitted: 01/20/2022 Status is pending  01/22/2022, CPhT Pharmacy Patient Advocate Specialist [facility] Patient Advocate Team Phone: [phone]   Fax: [phone]

## 2022-01-21 NOTE — Telephone Encounter (Signed)
Encounter opened in error

## 2022-01-21 NOTE — Telephone Encounter (Signed)
Patient Advocate Encounter  Prior Authorization for Pantoprazole Sodium 40MG  dr tablets has been approved.    Key Rx #: Katharine Look Effective dates: 01/20/2022 through 01/19/2023  01/21/2023, CPhT Pharmacy Patient Advocate Specialist [facility] Patient Advocate Team Phone: [phone]   Fax: [phone]

## 2022-02-02 ENCOUNTER — Ambulatory Visit (INDEPENDENT_AMBULATORY_CARE_PROVIDER_SITE_OTHER): Payer: BC Managed Care – PPO | Admitting: Family Medicine

## 2022-02-02 ENCOUNTER — Encounter: Payer: Self-pay | Admitting: Family Medicine

## 2022-02-02 VITALS — BP 111/63 | HR 63 | Temp 98.0°F | Ht 69.0 in | Wt 146.8 lb

## 2022-02-02 DIAGNOSIS — K219 Gastro-esophageal reflux disease without esophagitis: Secondary | ICD-10-CM

## 2022-02-02 DIAGNOSIS — Z0000 Encounter for general adult medical examination without abnormal findings: Secondary | ICD-10-CM

## 2022-02-02 DIAGNOSIS — L649 Androgenic alopecia, unspecified: Secondary | ICD-10-CM

## 2022-02-02 MED ORDER — FINASTERIDE 1 MG PO TABS: 1.0000 mg | ORAL_TABLET | Freq: Every day | ORAL | 3 refills | Status: DC

## 2022-02-02 NOTE — Progress Notes (Signed)
Subjective:  Patient ID: Reginald DESAULNIERS, male    DOB: 07/16/1990  Age: 31 y.o. MRN: [id number]  CC: Medication Refill   HPI Khadar MALIN CERVINI presents for recheck of GERD and for the use of propecia for hair loss. Having endoscopy later this week due to Barrett's esophagus dx at age 1. Continues daily PPI.  Missed his lab work at his last CPE. Would like to go agead with that. Nonfasting. Will get it drawn later in the week, fasting (after endoscopy.)     02/02/2022    7:57 AM 12/31/2021   11:46 AM 08/01/2020   11:18 AM  Depression screen PHQ 2/9  Decreased Interest 0 0 0  Down, Depressed, Hopeless 0 0 0  PHQ - 2 Score 0 0 0  Altered sleeping   0  Tired, decreased energy   0  Change in appetite   0  Feeling bad or failure about yourself    0  Trouble concentrating   0  Moving slowly or fidgety/restless   0  Suicidal thoughts   0  PHQ-9 Score   0    History Quade has a past medical history of Barrett's esophagus (2012) and Esophageal reflux.   He has a past surgical history that includes Wrist surgery (Right); Finger fracture surgery (Left); and Upper gi endoscopy.   His family history includes Colon polyps in his maternal grandfather and mother; Crohn's disease in his father; Ulcerative colitis in his father.He reports that he has never smoked. He has never used smokeless tobacco. He reports current alcohol use. He reports that he does not use drugs.    ROS Review of Systems  Constitutional:  Negative for fever.  Respiratory:  Negative for shortness of breath.   Cardiovascular:  Negative for chest pain.  Musculoskeletal:  Negative for arthralgias.  Skin:  Negative for rash.    Objective:  BP 111/63   Pulse 63   Temp 98 F (36.7 C)   Ht '5\' 9"'  (1.753 m)   Wt 146 lb 12.8 oz (66.6 kg)   SpO2 97%   BMI 21.68 kg/m   BP Readings from Last 3 Encounters:  02/02/22 111/63  01/12/22 100/70  12/31/21 125/82    Wt Readings from Last 3 Encounters:  02/02/22 146 lb 12.8  oz (66.6 kg)  01/12/22 147 lb (66.7 kg)  12/31/21 145 lb 12.8 oz (66.1 kg)     Physical Exam Vitals reviewed.  Constitutional:      Appearance: He is well-developed.  HENT:     Head: Normocephalic and atraumatic.     Right Ear: External ear normal.     Left Ear: External ear normal.     Mouth/Throat:     Pharynx: No oropharyngeal exudate or posterior oropharyngeal erythema.  Eyes:     Pupils: Pupils are equal, round, and reactive to light.  Cardiovascular:     Rate and Rhythm: Normal rate and regular rhythm.     Heart sounds: No murmur heard. Pulmonary:     Effort: No respiratory distress.     Breath sounds: Normal breath sounds.  Musculoskeletal:     Cervical back: Normal range of motion and neck supple.  Neurological:     Mental Status: He is alert and oriented to person, place, and time.       Assessment & Plan:   Vernor was seen today for medication refill.  Diagnoses and all orders for this visit:  Gastroesophageal reflux disease without esophagitis -  CBC with Differential/Platelet -     CMP14+EGFR -     Lipid panel  Male pattern baldness -     finasteride (PROPECIA) 1 MG tablet; Take 1 tablet (1 mg total) by mouth daily.  Well adult exam -     Lipid panel       I am having Jamier M. Cattell maintain his pantoprazole and finasteride.  Allergies as of 02/02/2022   No Known Allergies      Medication List        Accurate as of February 02, 2022 11:59 AM. If you have any questions, ask your nurse or doctor.          finasteride 1 MG tablet Commonly known as: PROPECIA Take 1 tablet (1 mg total) by mouth daily.   pantoprazole 40 MG tablet Commonly known as: PROTONIX Take 1 tablet (40 mg total) by mouth daily.         Follow-up: Return in about 1 year (around 02/03/2023) for [facility] physical.  Claretta Fraise, M.D.

## 2022-02-04 ENCOUNTER — Encounter: Payer: Self-pay | Admitting: Gastroenterology

## 2022-02-04 ENCOUNTER — Ambulatory Visit (AMBULATORY_SURGERY_CENTER): Payer: BC Managed Care – PPO | Admitting: Gastroenterology

## 2022-02-04 VITALS — BP 131/74 | HR 65 | Temp 98.0°F | Resp 12 | Ht 69.0 in | Wt 147.0 lb

## 2022-02-04 DIAGNOSIS — K449 Diaphragmatic hernia without obstruction or gangrene: Secondary | ICD-10-CM

## 2022-02-04 DIAGNOSIS — K219 Gastro-esophageal reflux disease without esophagitis: Secondary | ICD-10-CM

## 2022-02-04 DIAGNOSIS — K2270 Barrett's esophagus without dysplasia: Secondary | ICD-10-CM | POA: Diagnosis not present

## 2022-02-04 MED ORDER — SODIUM CHLORIDE 0.9 % IV SOLN: 500.0000 mL | Freq: Once | INTRAVENOUS | Status: DC

## 2022-02-04 NOTE — Op Note (Signed)
[facility] Patient Name: Reginald Mcdonald Procedure Date: 02/04/2022 10:04 AM MRN: [id number] Endoscopist: Meryl Dare , MD Age: 31 Referring MD:  Date of Birth: [birth year] Gender: Male Account #: [id number] Procedure:                Upper GI endoscopy Indications:              Surveillance for malignancy due to personal history                            of Barrett's esophagus Medicines:                Monitored Anesthesia Care Procedure:                Pre-Anesthesia Assessment:                           - Prior to the procedure, a History and Physical                            was performed, and patient medications and                            allergies were reviewed. The patient's tolerance of                            previous anesthesia was also reviewed. The risks                            and benefits of the procedure and the sedation                            options and risks were discussed with the patient.                            All questions were answered, and informed consent                            was obtained. Prior Anticoagulants: The patient has                            taken no previous anticoagulant or antiplatelet                            agents. ASA Grade Assessment: II - A patient with                            mild systemic disease. After reviewing the risks                            and benefits, the patient was deemed in                            satisfactory condition to undergo the procedure.  After obtaining informed consent, the endoscope was                            passed under direct vision. Throughout the                            procedure, the patient's blood pressure, pulse, and                            oxygen saturations were monitored continuously. The                            GIF [id number] #[id number] was introduced through the                            mouth, and advanced to the second  part of duodenum.                            The upper GI endoscopy was accomplished without                            difficulty. The patient tolerated the procedure                            well. Scope In: Scope Out: Findings:                 There were esophageal mucosal changes secondary to                            established short-segment Barrett's disease present                            in the distal esophagus. The maximum longitudinal                            extent of these mucosal changes was 2 cm in length.                            Mucosa was biopsied with a cold forceps for                            histology. One specimen bottle was sent to                            pathology.                           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                           The exam of the esophagus was otherwise normal.  A small hiatal hernia was present.                           The exam of the stomach was otherwise normal.                           The duodenal bulb and second portion of the                            duodenum were normal. Complications:            No immediate complications. Estimated Blood Loss:     Estimated blood loss was minimal. Impression:               - Esophageal mucosal changes secondary to                            established short-segment Barrett's disease.                            Biopsied.                           - Ectopic gastric mucosa in the proximal esophagus.                           - Small hiatal hernia.                           - Normal duodenal bulb and second portion of the                            duodenum. Recommendation:           - Patient has a contact number available for                            emergencies. The signs and symptoms of potential                            delayed complications were discussed with  the                            patient. Return to normal activities tomorrow.                            Written discharge instructions were provided to the                            patient.                           - Resume previous diet.                           - Follow antireflux measures.                           -  Continue present medications.                           - Await pathology results.                           - Repeat upper endoscopy after studies are complete                            for surveillance based on pathology results. Meryl Dare, MD 02/04/2022 10:20:11 AM This report has been signed electronically.

## 2022-02-04 NOTE — Progress Notes (Signed)
See 01/12/2022 H&P, no changes 

## 2022-02-04 NOTE — Progress Notes (Signed)
Report to PACU, RN, vss, BBS= Clear.  

## 2022-02-04 NOTE — Addendum Note (Signed)
Addended by: Dulce Sellar on: 02/04/2022 12:09 PM   Modules accepted: Orders

## 2022-02-04 NOTE — Patient Instructions (Signed)
Handout provided on hiatal hernia and antireflux regimen.   YOU HAD AN ENDOSCOPIC PROCEDURE TODAY AT THE [facility]:   Refer to the procedure report that was given to you for any specific questions about what was found during the examination.  If the procedure report does not answer your questions, please call your gastroenterologist to clarify.  If you requested that your care partner not be given the details of your procedure findings, then the procedure report has been included in a sealed envelope for you to review at your convenience later.  YOU SHOULD EXPECT: Some feelings of bloating in the abdomen. Passage of more gas than usual.  Walking can help get rid of the air that was put into your GI tract during the procedure and reduce the bloating. If you had a lower endoscopy (such as a colonoscopy or flexible sigmoidoscopy) you may notice spotting of blood in your stool or on the toilet paper. If you underwent a bowel prep for your procedure, you may not have a normal bowel movement for a few days.  Please Note:  You might notice some irritation and congestion in your nose or some drainage.  This is from the oxygen used during your procedure.  There is no need for concern and it should clear up in a day or so.  SYMPTOMS TO REPORT IMMEDIATELY:  Following upper endoscopy (EGD)  Vomiting of blood or coffee ground material  New chest pain or pain under the shoulder blades  Painful or persistently difficult swallowing  New shortness of breath  Fever of 100F or higher  Black, tarry-looking stools  For urgent or emergent issues, a gastroenterologist can be reached at any hour by calling [phone]. Do not use MyChart messaging for urgent concerns.    DIET:  We do recommend a small meal at first, but then you may proceed to your regular diet.  Drink plenty of fluids but you should avoid alcoholic beverages for 24 hours.  ACTIVITY:  You should plan to take it easy for the rest  of today and you should NOT DRIVE or use heavy machinery until tomorrow (because of the sedation medicines used during the test).    FOLLOW UP: Our staff will call the number listed on your records the next business day following your procedure.  We will call around 7:15- 8:00 am to check on you and address any questions or concerns that you may have regarding the information given to you following your procedure. If we do not reach you, we will leave a message.  If you develop any symptoms (ie: fever, flu-like symptoms, shortness of breath, cough etc.) before then, please call [phone].  If you test positive for Covid 19 in the 2 weeks post procedure, please call and report this information to [place].    If any biopsies were taken you will be contacted by phone or by letter within the next 1-3 weeks.  Please call us at [phone] if you have not heard about the biopsies in 3 weeks.    SIGNATURES/CONFIDENTIALITY: You and/or your care partner have signed paperwork which will be entered into your electronic medical record.  These signatures attest to the fact that that the information above on your After Visit Summary has been reviewed and is understood.  Full responsibility of the confidentiality of this discharge information lies with you and/or your care-partner.

## 2022-02-05 ENCOUNTER — Telehealth: Payer: Self-pay

## 2022-02-05 NOTE — Telephone Encounter (Signed)
  Follow up Call-     02/04/2022    9:14 AM  Call back number  Post procedure Call Back phone  # [phone]  Permission to leave phone message Yes     Patient questions:  Do you have a fever, pain , or abdominal swelling? No. Pain Score  0 *  Have you tolerated food without any problems? Yes.    Have you been able to return to your normal activities? Yes.    Do you have any questions about your discharge instructions: Diet   No. Medications  No. Follow up visit  No.  Do you have questions or concerns about your Care? No.  Actions: * If pain score is 4 or above: No action needed, pain <4.

## 2022-03-01 ENCOUNTER — Encounter: Payer: Self-pay | Admitting: Gastroenterology

## 2022-03-01 ENCOUNTER — Telehealth: Payer: Self-pay | Admitting: Gastroenterology

## 2022-03-01 NOTE — Telephone Encounter (Signed)
Spoke with patient & advised him that Dr. Russella Dar will need to review pathology results, and once he has done so patient will either receive a phone call or letter in the mail. Pt verbalized all understanding.

## 2022-03-01 NOTE — Telephone Encounter (Signed)
Patient would like the results of his endo. Please advise, thank you.

## 2022-05-24 ENCOUNTER — Encounter: Payer: Self-pay | Admitting: Family Medicine

## 2022-05-24 ENCOUNTER — Ambulatory Visit (INDEPENDENT_AMBULATORY_CARE_PROVIDER_SITE_OTHER): Payer: BC Managed Care – PPO | Admitting: Family Medicine

## 2022-05-24 DIAGNOSIS — J069 Acute upper respiratory infection, unspecified: Secondary | ICD-10-CM

## 2022-05-24 NOTE — Progress Notes (Signed)
    Subjective:    Patient ID: Reginald Mcdonald, male    DOB: 1990-11-13, 31 y.o.   MRN: [id number]   HPI: [name] is a 31 y.o. male presenting for 3 days of congestion. Moved into chest yesterday. Denies fever, dyspnea. Some chest pain today with cough. Moderate cough. Dry. Home covid test is negative. No fatigue or body aches.       02/02/2022    7:57 AM 12/31/2021   11:46 AM 08/01/2020   11:18 AM 06/20/2019    4:04 PM 12/21/2017    1:11 PM  Depression screen PHQ 2/9  Decreased Interest 0 0 0 0 0  Down, Depressed, Hopeless 0 0 0 0 0  PHQ - 2 Score 0 0 0 0 0  Altered sleeping   0    Tired, decreased energy   0    Change in appetite   0    Feeling bad or failure about yourself    0    Trouble concentrating   0    Moving slowly or fidgety/restless   0    Suicidal thoughts   0    PHQ-9 Score   0       Relevant past medical, surgical, family and social history reviewed and updated as indicated.  Interim medical history since our last visit reviewed. Allergies and medications reviewed and updated.  ROS:  Review of Systems  Constitutional:  Negative for activity change, chills and fever.  HENT:  Positive for congestion and sneezing. Negative for ear pain, hearing loss, nosebleeds, postnasal drip, rhinorrhea and sinus pressure.   Respiratory:  Positive for cough and chest tightness. Negative for shortness of breath.   Skin:  Negative for rash.     Social History   Tobacco Use  Smoking Status Never  Smokeless Tobacco Never       Objective:     Wt Readings from Last 3 Encounters:  02/04/22 147 lb (66.7 kg)  02/02/22 146 lb 12.8 oz (66.6 kg)  01/12/22 147 lb (66.7 kg)     Exam deferred. Pt. Harboring due to COVID 19. Phone visit performed.   Assessment & Plan:   1. Viral URI     No orders of the defined types were placed in this encounter.   No orders of the defined types were placed in this encounter.     Diagnoses and all orders for this  visit:  Viral URI    Virtual Visit via telephone Note  I discussed the limitations, risks, security and privacy concerns of performing an evaluation and management service by telephone and the availability of in person appointments. The patient was identified with two identifiers. Pt.expressed understanding and agreed to proceed. Pt. Is at home. Dr. Darlyn Read is in his office.  Follow Up Instructions:   I discussed the assessment and treatment plan with the patient. The patient was provided an opportunity to ask questions and all were answered. The patient agreed with the plan and demonstrated an understanding of the instructions.   The patient was advised to call back or seek an in-person evaluation if the symptoms worsen or if the condition fails to improve as anticipated.   Total minutes including chart review and phone contact time: 7   Follow up plan: Return if symptoms worsen or fail to improve.  Mechele Claude, MD [facility]

## 2022-05-31 ENCOUNTER — Ambulatory Visit (INDEPENDENT_AMBULATORY_CARE_PROVIDER_SITE_OTHER): Payer: BC Managed Care – PPO | Admitting: Family Medicine

## 2022-05-31 ENCOUNTER — Encounter: Payer: Self-pay | Admitting: Family Medicine

## 2022-05-31 VITALS — BP 113/66 | HR 78 | Temp 98.4°F | Ht 69.0 in | Wt 146.0 lb

## 2022-05-31 DIAGNOSIS — J0110 Acute frontal sinusitis, unspecified: Secondary | ICD-10-CM | POA: Diagnosis not present

## 2022-05-31 MED ORDER — AMOXICILLIN-POT CLAVULANATE 875-125 MG PO TABS: 1.0000 | ORAL_TABLET | Freq: Two times a day (BID) | ORAL | 0 refills | Status: DC

## 2022-05-31 NOTE — Progress Notes (Signed)
BP 113/66   Pulse 78   Temp 98.4 F (36.9 C)   Ht 5\' 9"  (1.753 m)   Wt 146 lb (66.2 kg)   SpO2 98%   BMI 21.56 kg/m    Subjective:   Patient ID: Reginald Mcdonald, male    DOB: 06/03/1991, 31 y.o.   MRN: [id number]  HPI: Reginald Mcdonald is a 31 y.o. male presenting on 05/31/2022 for Nasal Congestion (And head congestion) and Sore Throat   HPI Patient is coming in for sore throat nasal congestion and head congestion and sinus pressure.  He says he started with a cold last week and had a little bit of irritation in his throat and some drainage but then over the past 3 days he has had a lot more pressure behind his right eye then yesterday developed into aches and chills and not feeling well and it seems like it is worsening.  He has been using Sudafed and Flonase and they have helped some but then everything worsened over the past few days.  He is getting some postnasal drainage and some drainage coming out of that is brown in.  He does have a little bit of a cough and some sore throat as well.  Relevant past medical, surgical, family and social history reviewed and updated as indicated. Interim medical history since our last visit reviewed. Allergies and medications reviewed and updated.  Review of Systems  Constitutional:  Negative for chills and fever.  HENT:  Positive for congestion, postnasal drip, rhinorrhea, sinus pressure and sore throat. Negative for ear discharge, ear pain, sneezing and voice change.   Eyes:  Negative for pain, discharge, redness and visual disturbance.  Respiratory:  Positive for cough. Negative for shortness of breath and wheezing.   Cardiovascular:  Negative for chest pain and leg swelling.  Musculoskeletal:  Negative for back pain and gait problem.  Skin:  Negative for rash.  Neurological:  Positive for headaches.  All other systems reviewed and are negative.   Per HPI unless specifically indicated above   Allergies as of 05/31/2022   No Known  Allergies      Medication List        Accurate as of May 31, 2022  9:03 AM. If you have any questions, ask your nurse or doctor.          amoxicillin-clavulanate 875-125 MG tablet Commonly known as: AUGMENTIN Take 1 tablet by mouth 2 (two) times daily. Started by: June 02, 2022, MD   finasteride 1 MG tablet Commonly known as: PROPECIA Take 1 tablet (1 mg total) by mouth daily.   pantoprazole 40 MG tablet Commonly known as: PROTONIX Take 1 tablet (40 mg total) by mouth daily.         Objective:   BP 113/66   Pulse 78   Temp 98.4 F (36.9 C)   Ht 5\' 9"  (1.753 m)   Wt 146 lb (66.2 kg)   SpO2 98%   BMI 21.56 kg/m   Wt Readings from Last 3 Encounters:  05/31/22 146 lb (66.2 kg)  02/04/22 147 lb (66.7 kg)  02/02/22 146 lb 12.8 oz (66.6 kg)    Physical Exam Vitals and nursing note reviewed.  Constitutional:      General: He is not in acute distress.    Appearance: He is well-developed. He is not diaphoretic.  HENT:     Right Ear: Tympanic membrane is not erythematous, retracted or bulging.     Left Ear: Tympanic  membrane is retracted. Tympanic membrane is not erythematous or bulging.     Nose:     Right Sinus: Frontal sinus tenderness present. No maxillary sinus tenderness.     Left Sinus: No maxillary sinus tenderness or frontal sinus tenderness.     Mouth/Throat:     Mouth: Mucous membranes are moist.     Pharynx: Oropharynx is clear. No pharyngeal swelling, oropharyngeal exudate or posterior oropharyngeal erythema.     Tonsils: No tonsillar exudate or tonsillar abscesses.  Eyes:     General: No scleral icterus.    Conjunctiva/sclera: Conjunctivae normal.  Neck:     Thyroid: No thyromegaly.  Cardiovascular:     Rate and Rhythm: Normal rate and regular rhythm.     Heart sounds: Normal heart sounds. No murmur heard. Pulmonary:     Effort: Pulmonary effort is normal. No respiratory distress.     Breath sounds: Normal breath sounds. No  wheezing.  Musculoskeletal:        General: No swelling. Normal range of motion.     Cervical back: Neck supple.  Lymphadenopathy:     Cervical: No cervical adenopathy.  Skin:    General: Skin is warm and dry.     Findings: No rash.  Neurological:     Mental Status: He is alert and oriented to person, place, and time.     Coordination: Coordination normal.  Psychiatric:        Behavior: Behavior normal.       Assessment & Plan:   Problem List Items Addressed This Visit   None Visit Diagnoses     Acute non-recurrent frontal sinusitis    -  Primary   Relevant Medications   amoxicillin-clavulanate (AUGMENTIN) 875-125 MG tablet       Continue with the Flonase and the Sudafed and using amoxicillin, seems like his cold is now turned into a sinus infection. Follow up plan: Return if symptoms worsen or fail to improve.  Counseling provided for all of the vaccine components No orders of the defined types were placed in this encounter.   Arville Care, MD [facility] 05/31/2022, 9:03 AM

## 2023-01-26 ENCOUNTER — Other Ambulatory Visit: Payer: Self-pay | Admitting: Family Medicine

## 2023-01-26 DIAGNOSIS — L649 Androgenic alopecia, unspecified: Secondary | ICD-10-CM

## 2023-07-29 ENCOUNTER — Other Ambulatory Visit: Payer: Self-pay | Admitting: Family Medicine

## 2023-07-29 DIAGNOSIS — L649 Androgenic alopecia, unspecified: Secondary | ICD-10-CM

## 2023-07-29 MED ORDER — FINASTERIDE 1 MG PO TABS: 1.0000 mg | ORAL_TABLET | Freq: Every day | ORAL | 1 refills | Status: DC

## 2023-07-29 NOTE — Telephone Encounter (Signed)
Copied from CRM [id number]. Topic: Clinical - Medication Refill >> Jul 29, 2023 11:01 AM Reginald Mcdonald wrote: Most Recent Primary Care Visit:  Provider: Arville Care A  Department: [facility]  Visit Type: OFFICE VISIT  Date: 05/31/2022  Medication: finasteride (PROPECIA) 1 MG tablet  Has the patient contacted their pharmacy? Yes Said to call office  Is this the correct pharmacy for this prescription? Yes If no, delete pharmacy and type the correct one.  This is the patient's preferred pharmacy:  [facility] [phone] - [place], [place] - [address] [place] [address] Phone: [phone] Fax: [phone]  [facility] [address], [place] - [address] [place] [address] [place] [address] Phone: [phone] Fax: [phone]   Has the prescription been filled recently? No  Is the patient out of the medication? Yes  Has the patient been seen for an appointment in the last year OR does the patient have an upcoming appointment? Yes, scheduled for next month with dr Darlyn Read  Can we respond through MyChart? No  Agent: Please be advised that Rx refills may take up to 3 business days. We ask that you follow-up with your pharmacy.

## 2023-07-29 NOTE — Telephone Encounter (Signed)
Copied from CRM [id number]. Topic: Clinical - Medication Refill >> Jul 29, 2023 11:01 AM Geroge Baseman wrote: Most Recent Primary Care Visit:  Provider: Arville Care A  Department: [facility]  Visit Type: OFFICE VISIT  Date: 05/31/2022  Medication: finasteride (PROPECIA) 1 MG tablet  Has the patient contacted their pharmacy? Yes Said to call office  Is this the correct pharmacy for this prescription? Yes If no, delete pharmacy and type the correct one.  This is the patient's preferred pharmacy:  [phone] - [place], [place] - [address] [place] [address] Phone: [phone] Fax: [phone]  [facility] [address], [place] - [address] [place] [address] [place] [address] Phone: [phone] Fax: [phone]   Has the prescription been filled recently? No  Is the patient out of the medication? Yes  Has the patient been seen for an appointment in the last year OR does the patient have an upcoming appointment? Yes, scheduled for next month with dr Darlyn Read  Can we respond through MyChart? No  Agent: Please be advised that Rx refills may take up to 3 business days. We ask that you follow-up with your pharmacy.

## 2023-07-29 NOTE — Telephone Encounter (Signed)
Pt aware. He will call back to r/s.

## 2023-07-29 NOTE — Telephone Encounter (Signed)
Stacks NTBS last OV 02/02/22 NO RF sent to pharmacy last OV greater than a year

## 2023-08-17 ENCOUNTER — Ambulatory Visit: Payer: BC Managed Care – PPO | Admitting: Family Medicine

## 2023-08-22 ENCOUNTER — Ambulatory Visit: Payer: BC Managed Care – PPO | Admitting: Family Medicine

## 2023-08-23 ENCOUNTER — Ambulatory Visit: Payer: BC Managed Care – PPO | Admitting: Family Medicine

## 2023-09-13 ENCOUNTER — Encounter: Payer: Self-pay | Admitting: Family Medicine

## 2023-09-13 ENCOUNTER — Ambulatory Visit (INDEPENDENT_AMBULATORY_CARE_PROVIDER_SITE_OTHER): Payer: BC Managed Care – PPO | Admitting: Family Medicine

## 2023-09-13 VITALS — BP 117/65 | HR 65 | Temp 97.9°F | Ht 69.0 in | Wt 146.0 lb

## 2023-09-13 DIAGNOSIS — K2270 Barrett's esophagus without dysplasia: Secondary | ICD-10-CM | POA: Diagnosis not present

## 2023-09-13 DIAGNOSIS — L649 Androgenic alopecia, unspecified: Secondary | ICD-10-CM

## 2023-09-13 NOTE — Progress Notes (Signed)
 Chief Complaint  Patient presents with   Medication Refill    HPI  Patient presents today for recheck ofr use of finasteride for hair loss prevention. Working well for him.  Transfering to Dr. Rhea Belton, GI due to retirement of Dr. Russella Dar. Has enough meds for now. Symptoms under good control. Last EGD was 02/04/22. Bx was precancerous. Next EgD recommended for 02/2027  PMH: Smoking status noted ROS: Per HPI  Objective: BP 117/65   Pulse 65   Temp 97.9 F (36.6 C)   Ht 5\' 9"  (1.753 m)   Wt 146 lb (66.2 kg)   SpO2 97%   BMI 21.56 kg/m  Gen: NAD, alert, cooperative with exam HEENT: NCAT, EOMI, PERRL. The hair is full. No bald spots. Soft, fine hair.  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 Barrett's esophagus without dysplasia     No orders of the defined types were placed in this encounter.   No orders of the defined types were placed in this encounter.   Follow up as needed.  Mechele Claude, MD

## 2024-01-24 ENCOUNTER — Other Ambulatory Visit: Payer: Self-pay | Admitting: Family Medicine

## 2024-01-24 DIAGNOSIS — L649 Androgenic alopecia, unspecified: Secondary | ICD-10-CM

## 2024-05-22 ENCOUNTER — Encounter: Payer: Self-pay | Admitting: Family

## 2024-05-22 ENCOUNTER — Ambulatory Visit (INDEPENDENT_AMBULATORY_CARE_PROVIDER_SITE_OTHER): Admitting: Family

## 2024-05-22 VITALS — BP 102/66 | HR 91 | Temp 97.6°F | Ht 69.0 in | Wt 147.6 lb

## 2024-05-22 DIAGNOSIS — J069 Acute upper respiratory infection, unspecified: Secondary | ICD-10-CM | POA: Diagnosis not present

## 2024-05-22 MED ORDER — FLUTICASONE PROPIONATE 50 MCG/ACT NA SUSP: 2.0000 | Freq: Every day | NASAL | 6 refills | Status: AC

## 2024-05-22 MED ORDER — AMOXICILLIN-POT CLAVULANATE 875-125 MG PO TABS: 1.0000 | ORAL_TABLET | Freq: Two times a day (BID) | ORAL | 0 refills | Status: AC

## 2024-05-22 MED ORDER — CETIRIZINE HCL 10 MG PO TABS: 10.0000 mg | ORAL_TABLET | Freq: Every day | ORAL | 11 refills | Status: AC

## 2024-05-22 NOTE — Patient Instructions (Signed)

## 2024-05-22 NOTE — Progress Notes (Signed)
 Subjective:    Patient ID: Reginald Mcdonald, male    DOB: 18-Nov-1990, 33 y.o.   MRN: [id number]  Chief Complaint  Patient presents with   Headache   Sinus Problem   Pt presents to the office today with sinus congestion that started yesterday.  Sinus Problem Associated symptoms include congestion, headaches, sneezing and a sore throat. Pertinent negatives include no coughing, ear pain or swollen glands.  URI  This is a new problem. The current episode started in the past 7 days. The problem has been gradually worsening. There has been no fever. Associated symptoms include congestion, headaches, rhinorrhea, sinus pain, sneezing and a sore throat. Pertinent negatives include no coughing, ear pain, nausea, swollen glands or wheezing. He has tried increased fluids and sleep (flonase) for the symptoms. The treatment provided mild relief.      Review of Systems  HENT:  Positive for congestion, rhinorrhea, sinus pain, sneezing and sore throat. Negative for ear pain.   Respiratory:  Negative for cough and wheezing.   Gastrointestinal:  Negative for nausea.  Neurological:  Positive for headaches.  All other systems reviewed and are negative.   Social History   Socioeconomic History   Marital status: Single    Spouse name: Not on file   Number of children: 0   Years of education: Not on file   Highest education level: Not on file  Occupational History   Occupation: [employer]  Tobacco Use   Smoking status: Never   Smokeless tobacco: Never  Vaping Use   Vaping status: Never Used  Substance and Sexual Activity   Alcohol use: Yes    Alcohol/week: 0.0 standard drinks of alcohol    Comment: OCC   Drug use: No   Sexual activity: Not on file  Other Topics Concern   Not on file  Social History Narrative   Not on file   Social Drivers of Health   Financial Resource Strain: Not on file  Food Insecurity: Not on file  Transportation Needs: Not on file  Physical Activity: Not on  file  Stress: Not on file  Social Connections: Not on file   Family History  Problem Relation Age of Onset   Colon polyps Mother    Ulcerative colitis Father    Crohn's disease Father    Colon polyps Maternal Grandfather    Colon cancer Neg Hx    Rectal cancer Neg Hx    Stomach cancer Neg Hx    Esophageal cancer Neg Hx    Pancreatic cancer Neg Hx    Prostate cancer Neg Hx         Objective:   Physical Exam Vitals reviewed.  Constitutional:      General: He is not in acute distress.    Appearance: He is well-developed.  HENT:     Head: Normocephalic.     Right Ear: Tympanic membrane and external ear normal.     Left Ear: Tympanic membrane and external ear normal.     Nose:     Right Sinus: Maxillary sinus tenderness and frontal sinus tenderness present.     Left Sinus: Maxillary sinus tenderness and frontal sinus tenderness present.  Eyes:     General:        Right eye: No discharge.        Left eye: No discharge.     Pupils: Pupils are equal, round, and reactive to light.  Neck:     Thyroid: No thyromegaly.  Cardiovascular:  Rate and Rhythm: Normal rate and regular rhythm.     Heart sounds: Normal heart sounds. No murmur heard. Pulmonary:     Effort: Pulmonary effort is normal. No respiratory distress.     Breath sounds: Normal breath sounds. No wheezing.  Abdominal:     General: Bowel sounds are normal. There is no distension.     Palpations: Abdomen is soft.     Tenderness: There is no abdominal tenderness.  Musculoskeletal:        General: No tenderness. Normal range of motion.     Cervical back: Normal range of motion and neck supple.  Skin:    General: Skin is warm and dry.     Findings: No erythema or rash.  Neurological:     Mental Status: He is alert and oriented to person, place, and time.     Cranial Nerves: No cranial nerve deficit.     Deep Tendon Reflexes: Reflexes are normal and symmetric.  Psychiatric:        Behavior: Behavior normal.         Thought Content: Thought content normal.        Judgment: Judgment normal.       BP 102/66   Pulse 91   Temp 97.6 F (36.4 C) (Temporal)   Ht 5' 9 (1.753 m)   Wt 147 lb 9.6 oz (67 kg)   BMI 21.80 kg/m      Assessment & Plan:  Reginald Mcdonald comes in today with chief complaint of Headache and Sinus Problem   Diagnosis and orders addressed: 1. URI, acute (Primary) PT will continue Flonase, zyrtec and decongestant He is flying out of town at the end of the week. If symptoms worsen or do not improve, or worsen after flight he will start Augmentin .  - Take meds as prescribed - Use a cool mist humidifier  -Use saline nose sprays frequently -Force fluids -For any cough or congestion  Use plain Mucinex - regular strength or max strength is fine -For fever or aces or pains- take tylenol or ibuprofen. -Throat lozenges if help -Follow up if symptoms worsen or do not improve   - amoxicillin -clavulanate (AUGMENTIN ) 875-125 MG tablet; Take 1 tablet by mouth 2 (two) times daily.  Dispense: 14 tablet; Refill: 0 - fluticasone (FLONASE) 50 MCG/ACT nasal spray; Place 2 sprays into both nostrils daily.  Dispense: 16 g; Refill: 6 - cetirizine (ZYRTEC) 10 MG tablet; Take 1 tablet (10 mg total) by mouth daily.  Dispense: 30 tablet; Refill: 11     Bari Learn, FNP
